# Patient Record
Sex: Male | Born: 1975 | Race: White | Hispanic: No | Smoking: Current every day smoker
Health system: Southern US, Community
[De-identification: ages and names within clinical notes are randomized; demographics above are authoritative.]

## PROBLEM LIST (undated history)

## (undated) DIAGNOSIS — E119 Type 2 diabetes mellitus without complications: Secondary | ICD-10-CM

## (undated) DIAGNOSIS — G8321 Monoplegia of upper limb affecting right dominant side: Secondary | ICD-10-CM

## (undated) DIAGNOSIS — S3992XA Unspecified injury of lower back, initial encounter: Secondary | ICD-10-CM

## (undated) DIAGNOSIS — I10 Essential (primary) hypertension: Secondary | ICD-10-CM

## (undated) DIAGNOSIS — M1990 Unspecified osteoarthritis, unspecified site: Secondary | ICD-10-CM

## (undated) DIAGNOSIS — G43909 Migraine, unspecified, not intractable, without status migrainosus: Secondary | ICD-10-CM

---

## 1996-04-20 DIAGNOSIS — S3992XA Unspecified injury of lower back, initial encounter: Secondary | ICD-10-CM

## 1996-04-20 DIAGNOSIS — M1990 Unspecified osteoarthritis, unspecified site: Secondary | ICD-10-CM

## 1996-04-20 DIAGNOSIS — G8321 Monoplegia of upper limb affecting right dominant side: Secondary | ICD-10-CM

## 1996-04-20 HISTORY — DX: Unspecified osteoarthritis, unspecified site: M19.90

## 1996-04-20 HISTORY — DX: Monoplegia of upper limb affecting right dominant side: G83.21

## 1996-04-20 HISTORY — DX: Unspecified injury of lower back, initial encounter: S39.92XA

## 2000-04-20 HISTORY — PX: NERVE TRANSFER: SHX2084

## 2002-01-23 ENCOUNTER — Emergency Department (HOSPITAL_COMMUNITY): Admission: EM | Admit: 2002-01-23 | Discharge: 2002-01-23 | Payer: Self-pay | Admitting: Emergency Medicine

## 2003-07-30 ENCOUNTER — Ambulatory Visit (HOSPITAL_COMMUNITY): Admission: RE | Admit: 2003-07-30 | Discharge: 2003-07-30 | Payer: Self-pay | Admitting: Family Medicine

## 2005-11-24 ENCOUNTER — Other Ambulatory Visit: Payer: Self-pay

## 2005-11-24 ENCOUNTER — Emergency Department: Payer: Self-pay | Admitting: Emergency Medicine

## 2005-11-25 ENCOUNTER — Other Ambulatory Visit: Payer: Self-pay

## 2005-12-25 ENCOUNTER — Ambulatory Visit: Payer: Self-pay | Admitting: Family Medicine

## 2006-07-06 ENCOUNTER — Encounter: Admission: RE | Admit: 2006-07-06 | Discharge: 2006-07-07 | Payer: Self-pay | Admitting: Neurosurgery

## 2007-11-30 ENCOUNTER — Encounter: Admission: RE | Admit: 2007-11-30 | Discharge: 2007-11-30 | Payer: Self-pay | Admitting: Neurosurgery

## 2008-01-04 ENCOUNTER — Ambulatory Visit (HOSPITAL_COMMUNITY): Admission: RE | Admit: 2008-01-04 | Discharge: 2008-01-04 | Payer: Self-pay | Admitting: Neurosurgery

## 2008-08-29 ENCOUNTER — Encounter: Admission: RE | Admit: 2008-08-29 | Discharge: 2008-11-26 | Payer: Self-pay | Admitting: Physician Assistant

## 2009-06-18 ENCOUNTER — Encounter: Admission: RE | Admit: 2009-06-18 | Discharge: 2009-07-02 | Payer: Self-pay | Admitting: Family Medicine

## 2010-05-12 ENCOUNTER — Encounter: Payer: Self-pay | Admitting: Neurosurgery

## 2012-05-05 ENCOUNTER — Ambulatory Visit: Payer: Medicare Other | Attending: Neurosurgery | Admitting: Occupational Therapy

## 2012-05-05 DIAGNOSIS — M25649 Stiffness of unspecified hand, not elsewhere classified: Secondary | ICD-10-CM | POA: Insufficient documentation

## 2012-05-05 DIAGNOSIS — IMO0001 Reserved for inherently not codable concepts without codable children: Secondary | ICD-10-CM | POA: Insufficient documentation

## 2012-05-05 DIAGNOSIS — R609 Edema, unspecified: Secondary | ICD-10-CM | POA: Insufficient documentation

## 2012-05-12 ENCOUNTER — Ambulatory Visit: Payer: Medicare Other | Admitting: Occupational Therapy

## 2012-07-20 ENCOUNTER — Ambulatory Visit: Payer: Medicare Other | Attending: Neurosurgery | Admitting: Occupational Therapy

## 2012-07-20 DIAGNOSIS — R609 Edema, unspecified: Secondary | ICD-10-CM | POA: Insufficient documentation

## 2012-07-20 DIAGNOSIS — IMO0001 Reserved for inherently not codable concepts without codable children: Secondary | ICD-10-CM | POA: Insufficient documentation

## 2012-07-20 DIAGNOSIS — M25649 Stiffness of unspecified hand, not elsewhere classified: Secondary | ICD-10-CM | POA: Insufficient documentation

## 2013-04-20 HISTORY — PX: SKIN GRAFT: SHX250

## 2013-05-03 ENCOUNTER — Other Ambulatory Visit: Payer: Self-pay | Admitting: Family Medicine

## 2013-05-03 DIAGNOSIS — R945 Abnormal results of liver function studies: Secondary | ICD-10-CM

## 2013-05-03 DIAGNOSIS — R7989 Other specified abnormal findings of blood chemistry: Secondary | ICD-10-CM

## 2013-05-10 ENCOUNTER — Ambulatory Visit
Admission: RE | Admit: 2013-05-10 | Discharge: 2013-05-10 | Disposition: A | Discharge status: Home / Self Care | Payer: Medicare Other | Source: Ambulatory Visit | Attending: Family Medicine | Admitting: Family Medicine

## 2013-05-10 DIAGNOSIS — R945 Abnormal results of liver function studies: Secondary | ICD-10-CM

## 2013-05-10 DIAGNOSIS — R7989 Other specified abnormal findings of blood chemistry: Secondary | ICD-10-CM

## 2014-05-08 ENCOUNTER — Other Ambulatory Visit: Payer: Self-pay | Admitting: Family Medicine

## 2014-05-08 DIAGNOSIS — M898X9 Other specified disorders of bone, unspecified site: Secondary | ICD-10-CM

## 2014-05-09 ENCOUNTER — Ambulatory Visit
Admission: RE | Admit: 2014-05-09 | Discharge: 2014-05-09 | Disposition: A | Discharge status: Home / Self Care | Payer: Medicare Other | Source: Ambulatory Visit | Attending: Family Medicine | Admitting: Family Medicine

## 2014-05-09 DIAGNOSIS — M898X9 Other specified disorders of bone, unspecified site: Secondary | ICD-10-CM

## 2014-07-30 ENCOUNTER — Ambulatory Visit: Payer: Medicare Other | Attending: Family Medicine | Admitting: Occupational Therapy

## 2014-07-30 DIAGNOSIS — M79641 Pain in right hand: Secondary | ICD-10-CM | POA: Diagnosis not present
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
## 2014-10-29 ENCOUNTER — Encounter
Admission: RE | Admit: 2014-10-29 | Discharge: 2014-10-29 | Disposition: A | Discharge status: Home / Self Care | Payer: Medicare Other | Source: Ambulatory Visit | Attending: Podiatry | Admitting: Podiatry

## 2014-10-29 DIAGNOSIS — M7751 Other enthesopathy of right foot: Secondary | ICD-10-CM | POA: Insufficient documentation

## 2014-10-29 DIAGNOSIS — E119 Type 2 diabetes mellitus without complications: Secondary | ICD-10-CM | POA: Diagnosis not present

## 2014-10-29 DIAGNOSIS — Z01810 Encounter for preprocedural cardiovascular examination: Secondary | ICD-10-CM | POA: Insufficient documentation

## 2014-10-29 DIAGNOSIS — I10 Essential (primary) hypertension: Secondary | ICD-10-CM | POA: Insufficient documentation

## 2014-10-29 DIAGNOSIS — Z01812 Encounter for preprocedural laboratory examination: Secondary | ICD-10-CM | POA: Diagnosis not present

## 2014-10-29 HISTORY — DX: Unspecified osteoarthritis, unspecified site: M19.90

## 2014-10-29 HISTORY — DX: Unspecified injury of lower back, initial encounter: S39.92XA

## 2014-10-29 HISTORY — DX: Essential (primary) hypertension: I10

## 2014-10-29 HISTORY — DX: Type 2 diabetes mellitus without complications: E11.9

## 2014-10-29 HISTORY — DX: Monoplegia of upper limb affecting right dominant side: G83.21

## 2014-10-29 LAB — CBC
HCT: 46.4 % (ref 40.0–52.0)
Hemoglobin: 15.5 g/dL (ref 13.0–18.0)
MCH: 30.1 pg (ref 26.0–34.0)
MCHC: 33.4 g/dL (ref 32.0–36.0)
MCV: 90.1 fL (ref 80.0–100.0)
Platelets: 199 10*3/uL (ref 150–440)
RBC: 5.15 MIL/uL (ref 4.40–5.90)
RDW: 13.3 % (ref 11.5–14.5)
WBC: 9.8 10*3/uL (ref 3.8–10.6)

## 2014-10-29 LAB — BASIC METABOLIC PANEL
Anion gap: 9 (ref 5–15)
BUN: 12 mg/dL (ref 6–20)
CALCIUM: 9 mg/dL (ref 8.9–10.3)
CHLORIDE: 106 mmol/L (ref 101–111)
CO2: 24 mmol/L (ref 22–32)
Creatinine, Ser: 0.9 mg/dL (ref 0.61–1.24)
GFR calc non Af Amer: >60 mL/min (ref >60)
Glucose, Bld: 109 mg/dL — ABNORMAL HIGH (ref 65–99)
POTASSIUM: 3.7 mmol/L (ref 3.5–5.1)
SODIUM: 139 mmol/L (ref 135–145)

## 2014-10-29 LAB — DIFFERENTIAL
BASOS PCT: 0 %
Basophils Absolute: 0 10*3/uL (ref 0–0.1)
EOS ABS: 0.3 10*3/uL (ref 0–0.7)
EOS PCT: 3 %
LYMPHS ABS: 2 10*3/uL (ref 1.0–3.6)
LYMPHS PCT: 20 %
MONOS PCT: 5 %
Monocytes Absolute: 0.5 10*3/uL (ref 0.2–1.0)
NEUTROS PCT: 72 %
Neutro Abs: 7 10*3/uL — ABNORMAL HIGH (ref 1.4–6.5)

## 2014-10-29 NOTE — Patient Instructions (Signed)
  Your procedure is scheduled on: Friday November 02, 2014 Report to Same Day Surgery. To find out your arrival time please call [phone] between 1PM - 3PM on Thursday November 01, 2014.  Remember: Instructions that are not followed completely may result in serious medical risk, up to and including death, or upon the discretion of your surgeon and anesthesiologist your surgery may need to be rescheduled.    __x__ 1. Do not eat food or drink liquids after midnight. No gum chewing or hard candies.     __x__ 2. No Alcohol for 24 hours before or after surgery.   ____ 3. Bring all medications with you on the day of surgery if instructed.    __x__ 4. Notify your doctor if there is any change in your medical condition     (cold, fever, infections).     Do not wear jewelry, make-up, hairpins, clips or nail polish.  Do not wear lotions, powders, or perfumes. You may wear deodorant.  Do not shave 48 hours prior to surgery. Men may shave face and neck.  Do not bring valuables to the hospital.    [facility] is not responsible for any belongings or valuables.               Contacts, dentures or bridgework may not be worn into surgery.  Leave your suitcase in the car. After surgery it may be brought to your room.  For patients admitted to the hospital, discharge time is determined by your treatment team.   Patients discharged the day of surgery will not be allowed to drive home.    Please read over the following fact sheets that you were given:   [facility] Preparing for Surgery  __x__ Take these medicines the morning of surgery with A SIP OF WATER:    1. amLODipine (NORVASC)  2. gabapentin (NEURONTIN)  3. losartan (COZAAR)   4.HYDROcodone-acetaminophen (NORCO) (IF NEEDED)   ____ Fleet Enema (as directed)   _x___ Use CHG Soap as directed  ____ Use inhalers on the day of surgery  ____ Stop metformin 2 days prior to surgery    ____ Take 1/2 of usual insulin dose the night before surgery  and none on the morning of surgery.   ____ Stop Coumadin/Plavix/aspirin on does not apply.  __x__ Stop Anti-inflammatories (Ibuprofen, Aleve, Motrin) stop today. May take Norco or Tylenol for pain.  ____ Stop supplements until after surgery.    ____ Bring C-Pap to the hospital.

## 2014-11-02 ENCOUNTER — Ambulatory Visit: Payer: Medicare Other | Admitting: Anesthesiology

## 2014-11-02 ENCOUNTER — Ambulatory Visit
Admission: RE | Admit: 2014-11-02 | Discharge: 2014-11-02 | Disposition: A | Discharge status: Home / Self Care | Payer: Medicare Other | Source: Ambulatory Visit | Attending: Podiatry | Admitting: Podiatry

## 2014-11-02 ENCOUNTER — Encounter
Admission: RE | Disposition: A | Discharge status: Home / Self Care | Payer: Self-pay | Source: Ambulatory Visit | Attending: Podiatry

## 2014-11-02 ENCOUNTER — Encounter: Payer: Self-pay | Admitting: *Deleted

## 2014-11-02 DIAGNOSIS — M205X1 Other deformities of toe(s) (acquired), right foot: Secondary | ICD-10-CM | POA: Insufficient documentation

## 2014-11-02 DIAGNOSIS — G8929 Other chronic pain: Secondary | ICD-10-CM | POA: Diagnosis present

## 2014-11-02 DIAGNOSIS — Z79899 Other long term (current) drug therapy: Secondary | ICD-10-CM | POA: Insufficient documentation

## 2014-11-02 HISTORY — PX: WEIL OSTEOTOMY: SHX5044

## 2014-11-02 LAB — GLUCOSE, CAPILLARY: Glucose-Capillary: 93 mg/dL (ref 65–99)

## 2014-11-02 SURGERY — OSTEOTOMY, WEIL
Anesthesia: General | Laterality: Right | Wound class: Clean

## 2014-11-02 MED ORDER — ONDANSETRON HCL 4 MG/2ML IJ SOLN
INTRAMUSCULAR | Status: DC | PRN
  Administered 2014-11-02: 4 mg via INTRAVENOUS

## 2014-11-02 MED ORDER — OXYCODONE-ACETAMINOPHEN 5-325 MG PO TABS: 1.0000 | ORAL_TABLET | ORAL | Status: DC | PRN

## 2014-11-02 MED ORDER — CEFAZOLIN SODIUM-DEXTROSE 2-3 GM-% IV SOLR
INTRAVENOUS | Status: AC
  Administered 2014-11-02: 2 g via INTRAVENOUS
  Filled 2014-11-02: qty 50

## 2014-11-02 MED ORDER — OXYCODONE-ACETAMINOPHEN 5-325 MG PO TABS
2.0000 | ORAL_TABLET | Freq: Once | ORAL | Status: AC
  Administered 2014-11-02: 2 via ORAL

## 2014-11-02 MED ORDER — BUPIVACAINE HCL (PF) 0.5 % IJ SOLN
INTRAMUSCULAR | Status: DC | PRN
  Administered 2014-11-02: 10 mL

## 2014-11-02 MED ORDER — BUPIVACAINE HCL (PF) 0.5 % IJ SOLN
INTRAMUSCULAR | Status: AC
  Filled 2014-11-02: qty 30

## 2014-11-02 MED ORDER — MIDAZOLAM HCL 2 MG/2ML IJ SOLN
INTRAMUSCULAR | Status: DC | PRN
  Administered 2014-11-02: 2 mg via INTRAVENOUS

## 2014-11-02 MED ORDER — ONDANSETRON HCL 4 MG/2ML IJ SOLN: 4.0000 mg | Freq: Once | INTRAMUSCULAR | Status: DC | PRN

## 2014-11-02 MED ORDER — FENTANYL CITRATE (PF) 100 MCG/2ML IJ SOLN
INTRAMUSCULAR | Status: DC | PRN
  Administered 2014-11-02: 50 ug via INTRAVENOUS
  Administered 2014-11-02: 100 ug via INTRAVENOUS

## 2014-11-02 MED ORDER — FENTANYL CITRATE (PF) 100 MCG/2ML IJ SOLN: 25.0000 ug | INTRAMUSCULAR | Status: DC | PRN

## 2014-11-02 MED ORDER — SODIUM CHLORIDE 0.9 % IV SOLN
INTRAVENOUS | Status: DC
  Administered 2014-11-02 (×2): via INTRAVENOUS

## 2014-11-02 MED ORDER — OXYCODONE-ACETAMINOPHEN 5-325 MG PO TABS
ORAL_TABLET | ORAL | Status: AC
  Filled 2014-11-02: qty 2

## 2014-11-02 MED ORDER — CEFAZOLIN SODIUM-DEXTROSE 2-3 GM-% IV SOLR: 2.0000 g | Freq: Once | INTRAVENOUS | Status: DC

## 2014-11-02 MED ORDER — HYDROMORPHONE HCL 1 MG/ML IJ SOLN: 0.2500 mg | INTRAMUSCULAR | Status: DC | PRN

## 2014-11-02 MED ORDER — FAMOTIDINE 20 MG PO TABS
ORAL_TABLET | ORAL | Status: AC
  Administered 2014-11-02: 20 mg via ORAL
  Filled 2014-11-02: qty 1

## 2014-11-02 MED ORDER — FAMOTIDINE 20 MG PO TABS
20.0000 mg | ORAL_TABLET | Freq: Once | ORAL | Status: AC
  Administered 2014-11-02: 20 mg via ORAL

## 2014-11-02 MED ORDER — PROPOFOL 10 MG/ML IV BOLUS
INTRAVENOUS | Status: DC | PRN
  Administered 2014-11-02: 200 mg via INTRAVENOUS

## 2014-11-02 SURGICAL SUPPLY — 52 items
BAG COUNTER SPONGE EZ (MISCELLANEOUS) IMPLANT
BAG SPNG 4X4 CLR HAZ (MISCELLANEOUS)
BANDAGE CONFORM 2X5YD N/S (GAUZE/BANDAGES/DRESSINGS) IMPLANT
BANDAGE ELASTIC 4 CLIP NS LF (GAUZE/BANDAGES/DRESSINGS) ×3 IMPLANT
BIT DRILL CANN 3.0 (BIT) ×3 IMPLANT
BIT DRILL TWST COUNTRSNK 122IN (MISCELLANEOUS) ×1 IMPLANT
BLADE OSC/SAGITTAL MD 5.5X18 (BLADE) ×3 IMPLANT
BLADE OSC/SAGITTAL MD 9X18.5 (BLADE) IMPLANT
BLADE SURG 15 STRL LF DISP TIS (BLADE) ×4 IMPLANT
BLADE SURG 15 STRL SS (BLADE) ×12
BLADE SURG MINI STRL (BLADE) ×6 IMPLANT
BNDG ESMARK 4X12 TAN STRL LF (GAUZE/BANDAGES/DRESSINGS) ×3 IMPLANT
CANISTER SUCT 1200ML W/VALVE (MISCELLANEOUS) ×3 IMPLANT
CLOSURE WOUND 1/4X4 (GAUZE/BANDAGES/DRESSINGS) ×1
COUNTER SPONGE BAG EZ (MISCELLANEOUS)
CUFF TOURN 18 STER (MISCELLANEOUS) ×3 IMPLANT
CUFF TOURN DUAL PL 12 NO SLV (MISCELLANEOUS) IMPLANT
DRAPE FLUOR MINI C-ARM 54X84 (DRAPES) ×3 IMPLANT
DRILL TWIST COUNTERSINK 122IN (MISCELLANEOUS) ×3
DURAPREP 26ML APPLICATOR (WOUND CARE) ×3 IMPLANT
GAUZE PETRO XEROFOAM 1X8 (MISCELLANEOUS) ×3 IMPLANT
GAUZE SPONGE 4X4 12PLY STRL (GAUZE/BANDAGES/DRESSINGS) ×3 IMPLANT
GAUZE STRETCH 2X75IN STRL (MISCELLANEOUS) ×6 IMPLANT
GLOVE BIO SURGEON STRL SZ7.5 (GLOVE) ×6 IMPLANT
GLOVE INDICATOR 8.0 STRL GRN (GLOVE) ×9 IMPLANT
GOWN STRL REUS W/ TWL LRG LVL3 (GOWN DISPOSABLE) ×2 IMPLANT
GOWN STRL REUS W/TWL LRG LVL3 (GOWN DISPOSABLE) ×6
K-WIRE TROCAR .8X100 (WIRE) ×3 IMPLANT
LABEL OR SOLS (LABEL) IMPLANT
NDL SAFETY 25GX1.5 (NEEDLE) ×6 IMPLANT
NEEDLE FILTER BLUNT 18X 1/2SAF (NEEDLE) ×2
NEEDLE FILTER BLUNT 18X1 1/2 (NEEDLE) ×1 IMPLANT
NS IRRIG 500ML POUR BTL (IV SOLUTION) ×3 IMPLANT
PACK EXTREMITY ARMC (MISCELLANEOUS) ×3 IMPLANT
PAD GROUND ADULT SPLIT (MISCELLANEOUS) ×3 IMPLANT
PENCIL ELECTRO HAND CTR (MISCELLANEOUS) ×3 IMPLANT
RASP SM TEAR CROSS CUT (RASP) IMPLANT
SCREW CAN 2.2X15 (Screw) ×3 IMPLANT
SCREW COUNTERSINK 3.0 (Screw) ×3 IMPLANT
SOL PREP PVP 2OZ (MISCELLANEOUS) ×3
SOLUTION PREP PVP 2OZ (MISCELLANEOUS) ×1 IMPLANT
SPLINT CAST 1 STEP 3X12 (MISCELLANEOUS) ×3 IMPLANT
SPLINT CAST 1 STEP 5X30 WHT (MISCELLANEOUS) ×3 IMPLANT
STOCKINETTE STRL 6IN 960660 (GAUZE/BANDAGES/DRESSINGS) ×3 IMPLANT
STRAP SAFETY BODY (MISCELLANEOUS) ×3 IMPLANT
STRIP CLOSURE SKIN 1/4X4 (GAUZE/BANDAGES/DRESSINGS) ×2 IMPLANT
SUT ETHILON 5-0 FS-2 18 BLK (SUTURE) ×3 IMPLANT
SUT VIC AB 4-0 FS2 27 (SUTURE) ×3 IMPLANT
SWABSTK COMLB BENZOIN TINCTURE (MISCELLANEOUS) ×3 IMPLANT
SYRINGE 10CC LL (SYRINGE) ×3 IMPLANT
WIRE Z .045 C-WIRE SPADE TIP (WIRE) IMPLANT
WIRE Z .062 C-WIRE SPADE TIP (WIRE) IMPLANT

## 2014-11-02 NOTE — Anesthesia Postprocedure Evaluation (Signed)
  Anesthesia Post-op Note  [name]  Procedure(s) Performed: Procedure(s): WEIL OSTEOTOMY (Right)  Anesthesia type:General  Patient location: PACU  Post pain: Pain level controlled  Post assessment: Post-op Vital signs reviewed, Patient's Cardiovascular Status Stable, Respiratory Function Stable, Patent Airway and No signs of Nausea or vomiting  Post vital signs: Reviewed and stable  Last Vitals:  Filed Vitals:   11/02/14 0933  BP: 137/77  Pulse: 70  Temp: 36.6 C  Resp: 16    Level of consciousness: awake, alert  and patient cooperative  Complications: No apparent anesthesia complications

## 2014-11-02 NOTE — Discharge Instructions (Addendum)
1. Elevate right lower extremity.  2. Keep bandage clean, dry, and do not remove.  3. Sponge bathe only right lower extremity.  4. Wear surgical shoe on the right foot whenever walking or standing.  5. Take one pain pill, Percocet, every 4 hours as needed for pain
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
## 2014-11-02 NOTE — Progress Notes (Signed)
Pt from OR with right foot elevated on 2 pillows.

## 2014-11-02 NOTE — Transfer of Care (Signed)
Immediate Anesthesia Transfer of Care Note  [name]  Procedure(s) Performed: Procedure(s): WEIL OSTEOTOMY (Right)  Patient Location: PACU  Anesthesia Type:General  Level of Consciousness: awake  Airway & Oxygen Therapy: Patient Spontanous Breathing  Post-op Assessment: Report given to RN  Post vital signs: Reviewed  Last Vitals:  Filed Vitals:   11/02/14 0856  BP: 108/68  Pulse: 82  Temp: 36.5 C  Resp: 12    Complications: No apparent anesthesia complications

## 2014-11-02 NOTE — Op Note (Signed)
Date of operation: 11/02/2014.  Surgeon: Ricci Barkerodd W Malikah Principato DPM.  Preoperative diagnosis: Plantar displaced fifth metatarsal right foot.  Postoperative diagnosis same.  Procedure: Fifth metatarsal osteotomy right foot.  Anesthesia: LMA with local.  Hemostasis: Pneumatic tourniquet right ankle 250 mmHg  Estimated blood loss: Minimal.  Materials: One 15 mm, 2.2 mm Medartis screw.  Complications: None apparent.  Operative indications: This is a 39 year old male with chronic pain from a plantar displaced fifth metatarsal on his right foot. He has tried multiple conservative treatments with no significant improvement and he elects for surgery for an osteotomy of the fifth metatarsal to relieve pressure.  Operative procedure: Patient was taken to the operating room and placed on the table in the supine position. Following satisfactory LMA anesthesia the right foot was anesthetized with 10 cc of 0.5% bupivacaine plain around the fifth metatarsal region. A pneumatic tourniquet was applied at the level of the right ankle and the foot was prepped and draped in the usual sterile fashion. The foot was exsanguinated and the tourniquet inflated to 250 mmHg.   Attention was then directed to the dorsal aspect of the right foot where an approximate 4 cm linear incision was made coursing proximal to distal centered over the fifth metatarsal and metatarsophalangeal joint. The incision was deepened via sharp and blunt dissection with care taken to coagulate all bleeders. At the level of the joint a linear capsulotomy was performed and the capsular and periosteal tissues reflected off of the dorsal and medial head of fifth metatarsal. A lateral prominence was noted which was resected using a bone saw. Next attention was directed to the dorsal aspect of the fifth metatarsal where an osteotomy was performed from dorsal distal to proximal plantar with care taken to leave the proximal portion of the osteotomy and cortex  intact. The osteotomy was then feathered to raise the fifth metatarsal. There was noted to be good reduction of the deformity clinically and then the osteotomy was irrigated with sterile saline. Next a 2.2 mm Medartis screw, 15 mm length, was inserted in standard fashion into the fifth metatarsal. There was noted be good coaptation of the osteotomy and placement of the screw. Intraoperative FluoroScan views revealed good placement of the screw and osteotomy. The wound was then flushed with copious amounts of sterile saline and closed using 40 Vicryls running suture for all layers from capsular and periosteal to deep and superficial subcutaneous followed by skin closure. Tincture of benzoin and Steri-Strips applied followed by 4 x 4's and a sterile bandage. The tourniquet was released and blood flow noted to return immediately to the foot 5. An Ace wrap was applied for compression. Patient tolerated procedure and anesthesia well and was transported to the PACU with vital signs stable and in good condition.

## 2014-11-02 NOTE — H&P (Signed)
History and physical in the chart was reviewed. No changes in stable for surgery.

## 2014-11-02 NOTE — Anesthesia Preprocedure Evaluation (Addendum)
Anesthesia Evaluation  Patient identified by MRN, date of birth, ID band Patient awake    Reviewed: Allergy & Precautions, NPO status , Patient's Chart, lab work & pertinent test results  History of Anesthesia Complications Negative for: history of anesthetic complications  Airway Mallampati: II  TM Distance: >3 FB Neck ROM: Full    Dental  (+) Missing   Pulmonary neg pulmonary ROS, Current Smoker,  breath sounds clear to auscultation  Pulmonary exam normal       Cardiovascular Exercise Tolerance: Good hypertension, Pt. on medications Normal cardiovascular examRhythm:Regular Rate:Normal     Neuro/Psych negative neurological ROS  negative psych ROS   GI/Hepatic negative GI ROS, Neg liver ROS,   Endo/Other  diabetes, Well Controlled, Type 2, Oral Hypoglycemic Agents  Renal/GU negative Renal ROS  negative genitourinary   Musculoskeletal  (+) Arthritis -, Osteoarthritis,    Abdominal   Peds negative pediatric ROS (+)  Hematology negative hematology ROS (+)   Anesthesia Other Findings   Reproductive/Obstetrics negative OB ROS                            Anesthesia Physical Anesthesia Plan  ASA: III  Anesthesia Plan: General   Post-op Pain Management:    Induction: Intravenous  Airway Management Planned: LMA  Additional Equipment:   Intra-op Plan:   Post-operative Plan: Extubation in OR  Informed Consent: I have reviewed the patients History and Physical, chart, labs and discussed the procedure including the risks, benefits and alternatives for the proposed anesthesia with the patient or authorized representative who has indicated his/her understanding and acceptance.   Dental advisory given  Plan Discussed with: CRNA and Surgeon  Anesthesia Plan Comments:         Anesthesia Quick Evaluation

## 2015-02-19 ENCOUNTER — Encounter: Payer: Self-pay | Admitting: Occupational Therapy

## 2015-02-19 NOTE — Therapy (Signed)
[facility] [address] [place], [place], [address] Phone: [phone]   Fax:  [phone]  Patient Details  Name: Jonathon Page MRN: [id number] Date of Birth: 07/02/1975 Referring Provider:  No ref. provider found  Encounter Date: 02/19/2015   Pt has not returned since O.T. Visit on 07/30/14 for splinting purposes. Will resolve episode of care  Kelli ChurnBallie, Kaeden Mester Johnson, OTR/L 02/19/2015, 8:35 AM  [facility] [address] [place], [place], [address] Phone: [phone]   Fax:  [phone]

## 2018-11-29 ENCOUNTER — Ambulatory Visit: Payer: Medicare Other | Attending: Nurse Practitioner | Admitting: Occupational Therapy

## 2018-11-29 ENCOUNTER — Other Ambulatory Visit: Payer: Self-pay

## 2018-11-29 DIAGNOSIS — M79601 Pain in right arm: Secondary | ICD-10-CM | POA: Diagnosis present
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
## 2019-04-05 ENCOUNTER — Emergency Department
Admission: EM | Admit: 2019-04-05 | Discharge: 2019-04-05 | Discharge status: Against Medical Advice | Payer: Medicare Other | Attending: Emergency Medicine | Admitting: Emergency Medicine

## 2019-04-05 ENCOUNTER — Other Ambulatory Visit: Payer: Self-pay

## 2019-04-05 DIAGNOSIS — R1032 Left lower quadrant pain: Secondary | ICD-10-CM | POA: Diagnosis present

## 2019-04-05 DIAGNOSIS — F17210 Nicotine dependence, cigarettes, uncomplicated: Secondary | ICD-10-CM | POA: Diagnosis not present

## 2019-04-05 DIAGNOSIS — Z7984 Long term (current) use of oral hypoglycemic drugs: Secondary | ICD-10-CM | POA: Insufficient documentation

## 2019-04-05 DIAGNOSIS — I10 Essential (primary) hypertension: Secondary | ICD-10-CM | POA: Diagnosis not present

## 2019-04-05 DIAGNOSIS — Z5320 Procedure and treatment not carried out because of patient's decision for unspecified reasons: Secondary | ICD-10-CM | POA: Diagnosis not present

## 2019-04-05 DIAGNOSIS — Z79899 Other long term (current) drug therapy: Secondary | ICD-10-CM | POA: Diagnosis not present

## 2019-04-05 DIAGNOSIS — E119 Type 2 diabetes mellitus without complications: Secondary | ICD-10-CM | POA: Insufficient documentation

## 2019-04-05 MED ORDER — ONDANSETRON HCL 4 MG/2ML IJ SOLN: 4.0000 mg | Freq: Once | INTRAMUSCULAR | Status: DC

## 2019-04-05 MED ORDER — FENTANYL CITRATE (PF) 100 MCG/2ML IJ SOLN: 50.0000 ug | Freq: Once | INTRAMUSCULAR | Status: DC

## 2019-04-05 NOTE — Discharge Instructions (Addendum)
Please return as soon as possible to continue your evaluation
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
## 2019-04-05 NOTE — ED Triage Notes (Signed)
Pt to the er for lower abd pain that started last night. When asked if pt was having any other symptoms, pt states he has a hx of panic attacks and feels like that that is making it worse. Pt denies any other symptoms.

## 2019-04-23 ENCOUNTER — Emergency Department: Payer: Medicare Other

## 2019-04-23 ENCOUNTER — Other Ambulatory Visit: Payer: Self-pay

## 2019-04-23 ENCOUNTER — Inpatient Hospital Stay
Admission: EM | Admit: 2019-04-23 | Discharge: 2019-04-24 | DRG: 378 | Discharge status: Against Medical Advice | Payer: Medicare Other | Attending: Internal Medicine | Admitting: Internal Medicine

## 2019-04-23 DIAGNOSIS — K922 Gastrointestinal hemorrhage, unspecified: Secondary | ICD-10-CM | POA: Diagnosis not present

## 2019-04-23 DIAGNOSIS — Z5329 Procedure and treatment not carried out because of patient's decision for other reasons: Secondary | ICD-10-CM | POA: Diagnosis present

## 2019-04-23 DIAGNOSIS — R112 Nausea with vomiting, unspecified: Secondary | ICD-10-CM | POA: Diagnosis not present

## 2019-04-23 DIAGNOSIS — I160 Hypertensive urgency: Secondary | ICD-10-CM | POA: Diagnosis present

## 2019-04-23 DIAGNOSIS — F419 Anxiety disorder, unspecified: Secondary | ICD-10-CM | POA: Diagnosis present

## 2019-04-23 DIAGNOSIS — N179 Acute kidney failure, unspecified: Secondary | ICD-10-CM | POA: Diagnosis present

## 2019-04-23 DIAGNOSIS — Z20822 Contact with and (suspected) exposure to covid-19: Secondary | ICD-10-CM | POA: Diagnosis present

## 2019-04-23 DIAGNOSIS — E119 Type 2 diabetes mellitus without complications: Secondary | ICD-10-CM | POA: Diagnosis present

## 2019-04-23 DIAGNOSIS — I10 Essential (primary) hypertension: Secondary | ICD-10-CM | POA: Diagnosis present

## 2019-04-23 DIAGNOSIS — Z7984 Long term (current) use of oral hypoglycemic drugs: Secondary | ICD-10-CM | POA: Diagnosis not present

## 2019-04-23 DIAGNOSIS — E860 Dehydration: Secondary | ICD-10-CM | POA: Diagnosis present

## 2019-04-23 DIAGNOSIS — E1142 Type 2 diabetes mellitus with diabetic polyneuropathy: Secondary | ICD-10-CM | POA: Diagnosis present

## 2019-04-23 DIAGNOSIS — Z79899 Other long term (current) drug therapy: Secondary | ICD-10-CM

## 2019-04-23 DIAGNOSIS — Z79891 Long term (current) use of opiate analgesic: Secondary | ICD-10-CM | POA: Diagnosis not present

## 2019-04-23 DIAGNOSIS — F17210 Nicotine dependence, cigarettes, uncomplicated: Secondary | ICD-10-CM | POA: Diagnosis present

## 2019-04-23 LAB — BASIC METABOLIC PANEL
Anion gap: 14 (ref 5–15)
BUN: 33 mg/dL — ABNORMAL HIGH (ref 6–20)
CO2: 31 mmol/L (ref 22–32)
Calcium: 9.6 mg/dL (ref 8.9–10.3)
Chloride: 99 mmol/L (ref 98–111)
Creatinine, Ser: 1.61 mg/dL — ABNORMAL HIGH (ref 0.61–1.24)
GFR calc Af Amer: >60 mL/min (ref >60)
GFR calc non Af Amer: 52 mL/min — ABNORMAL LOW (ref >60)
Glucose, Bld: 160 mg/dL — ABNORMAL HIGH (ref 70–99)
Potassium: 3.9 mmol/L (ref 3.5–5.1)
Sodium: 144 mmol/L (ref 135–145)

## 2019-04-23 LAB — CBC
HCT: 50.8 % (ref 39.0–52.0)
HCT: 49.6 % (ref 39.0–52.0)
Hemoglobin: 18.1 g/dL — ABNORMAL HIGH (ref 13.0–17.0)
Hemoglobin: 16.5 g/dL (ref 13.0–17.0)
MCH: 29.8 pg (ref 26.0–34.0)
MCH: 29.7 pg (ref 26.0–34.0)
MCHC: 35.6 g/dL (ref 30.0–36.0)
MCHC: 33.3 g/dL (ref 30.0–36.0)
MCV: 83.6 fL (ref 80.0–100.0)
MCV: 89.2 fL (ref 80.0–100.0)
Platelets: 301 10*3/uL (ref 150–400)
Platelets: 306 10*3/uL (ref 150–400)
RBC: 6.08 MIL/uL — ABNORMAL HIGH (ref 4.22–5.81)
RBC: 5.56 MIL/uL (ref 4.22–5.81)
RDW: 13.8 % (ref 11.5–15.5)
RDW: 13.7 % (ref 11.5–15.5)
WBC: 17.5 10*3/uL — ABNORMAL HIGH (ref 4.0–10.5)
WBC: 14.7 10*3/uL — ABNORMAL HIGH (ref 4.0–10.5)
nRBC: 0 % (ref 0.0–0.2)
nRBC: 0 % (ref 0.0–0.2)

## 2019-04-23 LAB — COMPREHENSIVE METABOLIC PANEL
ALT: 36 U/L (ref 0–44)
AST: 34 U/L (ref 15–41)
Albumin: 4.8 g/dL (ref 3.5–5.0)
Alkaline Phosphatase: 75 U/L (ref 38–126)
Anion gap: 17 — ABNORMAL HIGH (ref 5–15)
BUN: 34 mg/dL — ABNORMAL HIGH (ref 6–20)
CO2: 27 mmol/L (ref 22–32)
Calcium: 10.6 mg/dL — ABNORMAL HIGH (ref 8.9–10.3)
Chloride: 97 mmol/L — ABNORMAL LOW (ref 98–111)
Creatinine, Ser: 1.92 mg/dL — ABNORMAL HIGH (ref 0.61–1.24)
GFR calc Af Amer: 49 mL/min — ABNORMAL LOW (ref >60)
GFR calc non Af Amer: 42 mL/min — ABNORMAL LOW (ref >60)
Glucose, Bld: 226 mg/dL — ABNORMAL HIGH (ref 70–99)
Potassium: 3.7 mmol/L (ref 3.5–5.1)
Sodium: 141 mmol/L (ref 135–145)
Total Bilirubin: 0.9 mg/dL (ref 0.3–1.2)
Total Protein: 9.1 g/dL — ABNORMAL HIGH (ref 6.5–8.1)

## 2019-04-23 LAB — LIPASE, BLOOD: Lipase: 25 U/L (ref 11–51)

## 2019-04-23 LAB — HEMOGLOBIN AND HEMATOCRIT, BLOOD
HCT: 45.9 % (ref 39.0–52.0)
Hemoglobin: 16 g/dL (ref 13.0–17.0)

## 2019-04-23 LAB — GLUCOSE, CAPILLARY: Glucose-Capillary: 120 mg/dL — ABNORMAL HIGH (ref 70–99)

## 2019-04-23 MED ORDER — MAGNESIUM HYDROXIDE 400 MG/5ML PO SUSP: 30.0000 mL | Freq: Every day | ORAL | Status: DC | PRN

## 2019-04-23 MED ORDER — PANTOPRAZOLE SODIUM 40 MG IV SOLR: 40.0000 mg | Freq: Two times a day (BID) | INTRAVENOUS | Status: DC

## 2019-04-23 MED ORDER — AMLODIPINE BESYLATE 5 MG PO TABS
10.0000 mg | ORAL_TABLET | ORAL | Status: DC
  Administered 2019-04-24: 09:00:00 10 mg via ORAL
  Filled 2019-04-23: qty 2

## 2019-04-23 MED ORDER — ONDANSETRON HCL 4 MG/2ML IJ SOLN
4.0000 mg | Freq: Once | INTRAMUSCULAR | Status: AC
  Administered 2019-04-23: 16:00:00 4 mg via INTRAVENOUS
  Filled 2019-04-23: qty 2

## 2019-04-23 MED ORDER — CLONAZEPAM 1 MG PO TABS: 1.0000 mg | ORAL_TABLET | Freq: Three times a day (TID) | ORAL | Status: DC

## 2019-04-23 MED ORDER — SODIUM CHLORIDE 0.9 % IV SOLN: Freq: Once | INTRAVENOUS | Status: AC

## 2019-04-23 MED ORDER — LABETALOL HCL 5 MG/ML IV SOLN: 20.0000 mg | INTRAVENOUS | Status: DC | PRN

## 2019-04-23 MED ORDER — GABAPENTIN 600 MG PO TABS
600.0000 mg | ORAL_TABLET | Freq: Three times a day (TID) | ORAL | Status: DC
  Administered 2019-04-23 – 2019-04-24 (×2): 600 mg via ORAL
  Filled 2019-04-23 (×2): qty 1

## 2019-04-23 MED ORDER — SODIUM CHLORIDE 0.9 % IV SOLN: INTRAVENOUS | Status: DC

## 2019-04-23 MED ORDER — CLONAZEPAM 0.5 MG PO TABS: 0.5000 mg | ORAL_TABLET | Freq: Two times a day (BID) | ORAL | Status: DC | PRN

## 2019-04-23 MED ORDER — SUMATRIPTAN SUCCINATE 50 MG PO TABS
100.0000 mg | ORAL_TABLET | Freq: Once | ORAL | Status: DC | PRN
  Filled 2019-04-23: qty 2

## 2019-04-23 MED ORDER — LOSARTAN POTASSIUM 50 MG PO TABS: 100.0000 mg | ORAL_TABLET | ORAL | Status: DC

## 2019-04-23 MED ORDER — OXYCODONE HCL 5 MG PO TABS: 15.0000 mg | ORAL_TABLET | Freq: Four times a day (QID) | ORAL | Status: DC | PRN

## 2019-04-23 MED ORDER — MORPHINE SULFATE (PF) 4 MG/ML IV SOLN
4.0000 mg | Freq: Once | INTRAVENOUS | Status: AC
  Administered 2019-04-23: 16:00:00 4 mg via INTRAVENOUS
  Filled 2019-04-23: qty 1

## 2019-04-23 MED ORDER — ASPIRIN EC 81 MG PO TBEC: 81.0000 mg | DELAYED_RELEASE_TABLET | Freq: Every day | ORAL | Status: DC

## 2019-04-23 MED ORDER — OXYCODONE-ACETAMINOPHEN 5-325 MG PO TABS
1.0000 | ORAL_TABLET | ORAL | Status: DC | PRN
  Administered 2019-04-24: 09:00:00 2 via ORAL
  Filled 2019-04-23: qty 2

## 2019-04-23 MED ORDER — SODIUM CHLORIDE 0.9 % IV SOLN: 80.0000 mg | Freq: Once | INTRAVENOUS | Status: DC

## 2019-04-23 MED ORDER — SODIUM CHLORIDE 0.9 % IV SOLN
80.0000 mg | Freq: Once | INTRAVENOUS | Status: AC
  Administered 2019-04-23: 80 mg via INTRAVENOUS
  Filled 2019-04-23: qty 80

## 2019-04-23 MED ORDER — IOHEXOL 300 MG/ML  SOLN
75.0000 mL | Freq: Once | INTRAMUSCULAR | Status: AC | PRN
  Administered 2019-04-23: 17:00:00 75 mL via INTRAVENOUS

## 2019-04-23 MED ORDER — SODIUM CHLORIDE 0.9 % IV SOLN: 8.0000 mg/h | INTRAVENOUS | Status: DC

## 2019-04-23 MED ORDER — ACETAMINOPHEN 650 MG RE SUPP: 650.0000 mg | Freq: Four times a day (QID) | RECTAL | Status: DC | PRN

## 2019-04-23 MED ORDER — SODIUM CHLORIDE 0.9 % IV SOLN
8.0000 mg/h | INTRAVENOUS | Status: DC
  Administered 2019-04-24: 8 mg/h via INTRAVENOUS
  Filled 2019-04-23: qty 80

## 2019-04-23 MED ORDER — TRAZODONE HCL 50 MG PO TABS: 25.0000 mg | ORAL_TABLET | Freq: Every evening | ORAL | Status: DC | PRN

## 2019-04-23 MED ORDER — ACETAMINOPHEN 325 MG PO TABS: 650.0000 mg | ORAL_TABLET | Freq: Four times a day (QID) | ORAL | Status: DC | PRN

## 2019-04-23 MED ORDER — MORPHINE SULFATE (PF) 4 MG/ML IV SOLN
4.0000 mg | Freq: Once | INTRAVENOUS | Status: AC
  Administered 2019-04-23: 4 mg via INTRAVENOUS
  Filled 2019-04-23: qty 1

## 2019-04-23 MED ORDER — INSULIN ASPART 100 UNIT/ML ~~LOC~~ SOLN: 0.0000 [IU] | Freq: Four times a day (QID) | SUBCUTANEOUS | Status: DC

## 2019-04-23 MED ORDER — PANTOPRAZOLE SODIUM 40 MG IV SOLR: 40.0000 mg | Freq: Once | INTRAVENOUS | Status: DC

## 2019-04-23 NOTE — ED Notes (Signed)
Pt given another warm blanket and lights dimmed again. Bed locked low. Rails up. Call bell within reach.
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
## 2019-04-23 NOTE — ED Notes (Signed)
Pt told several times by nurse that he needs to keep his mask on. Pt refused to wear mask.
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
## 2019-04-23 NOTE — ED Notes (Signed)
Pt noted to have vomited on floor. Vomit dark in color. MD Mayford Knife at bedside to guiac test vomit

## 2019-04-23 NOTE — ED Notes (Addendum)
Pt calmly laying in bed. Relaxed. Pharm called this RN to notify that klonopin yet to be verified as still in process of confirming order since pt also has PRN klonopin. Will hold until orders verified.

## 2019-04-23 NOTE — ED Notes (Signed)
Messaged about Protonix and Klonopin again.

## 2019-04-23 NOTE — ED Triage Notes (Signed)
Pt arrives to ED c/o of vomiting that began last night. States emesis is red and black. Denies ulcer. Denies blood thinner use or hx of same. A&O. Denies diarrhea.

## 2019-04-23 NOTE — ED Notes (Signed)
First Nurse Note: Pt to ED via POV c/o vomiting since last night. Pt is in NAD.

## 2019-04-23 NOTE — ED Notes (Signed)
Pt resting comfortably at this time.

## 2019-04-24 LAB — BASIC METABOLIC PANEL
Anion gap: 11 (ref 5–15)
BUN: 30 mg/dL — ABNORMAL HIGH (ref 6–20)
CO2: 31 mmol/L (ref 22–32)
Calcium: 9.1 mg/dL (ref 8.9–10.3)
Chloride: 102 mmol/L (ref 98–111)
Creatinine, Ser: 1.23 mg/dL (ref 0.61–1.24)
GFR calc Af Amer: >60 mL/min (ref >60)
GFR calc non Af Amer: >60 mL/min (ref >60)
Glucose, Bld: 128 mg/dL — ABNORMAL HIGH (ref 70–99)
Potassium: 3.9 mmol/L (ref 3.5–5.1)
Sodium: 144 mmol/L (ref 135–145)

## 2019-04-24 LAB — CBC
HCT: 48 % (ref 39.0–52.0)
Hemoglobin: 15.3 g/dL (ref 13.0–17.0)
MCH: 29 pg (ref 26.0–34.0)
MCHC: 31.9 g/dL (ref 30.0–36.0)
MCV: 91.1 fL (ref 80.0–100.0)
Platelets: 212 10*3/uL (ref 150–400)
RBC: 5.27 MIL/uL (ref 4.22–5.81)
RDW: 14.1 % (ref 11.5–15.5)
WBC: 15 10*3/uL — ABNORMAL HIGH (ref 4.0–10.5)
nRBC: 0 % (ref 0.0–0.2)

## 2019-04-24 LAB — HEPATIC FUNCTION PANEL
ALT: 28 U/L (ref 0–44)
AST: 24 U/L (ref 15–41)
Albumin: 4 g/dL (ref 3.5–5.0)
Alkaline Phosphatase: 58 U/L (ref 38–126)
Bilirubin, Direct: 0.1 mg/dL (ref 0.0–0.2)
Indirect Bilirubin: 0.8 mg/dL (ref 0.3–0.9)
Total Bilirubin: 0.9 mg/dL (ref 0.3–1.2)
Total Protein: 7.4 g/dL (ref 6.5–8.1)

## 2019-04-24 LAB — GLUCOSE, CAPILLARY
Glucose-Capillary: 112 mg/dL — ABNORMAL HIGH (ref 70–99)
Glucose-Capillary: 117 mg/dL — ABNORMAL HIGH (ref 70–99)
Glucose-Capillary: 128 mg/dL — ABNORMAL HIGH (ref 70–99)

## 2019-04-24 LAB — SARS CORONAVIRUS 2 (TAT 6-24 HRS): SARS Coronavirus 2: NEGATIVE

## 2019-04-24 LAB — HEMOGLOBIN A1C
Hgb A1c MFr Bld: 6.5 % — ABNORMAL HIGH (ref 4.8–5.6)
Mean Plasma Glucose: 139.85 mg/dL

## 2019-04-24 LAB — HIV ANTIBODY (ROUTINE TESTING W REFLEX): HIV Screen 4th Generation wRfx: NONREACTIVE

## 2019-04-24 SURGERY — EGD (ESOPHAGOGASTRODUODENOSCOPY)
Anesthesia: General

## 2019-04-24 NOTE — ED Notes (Signed)
Pt's O2 sat dec to 91% while sleeping. Placed on 2L; now 96%.

## 2019-04-24 NOTE — Progress Notes (Signed)
Patient left AMA, MD notified, IV removed.

## 2019-04-24 NOTE — ED Notes (Signed)
Called pharm as have messaged multiple times and still haven't received protonix continuous IV bag.

## 2019-05-04 ENCOUNTER — Other Ambulatory Visit: Payer: Self-pay

## 2019-05-04 ENCOUNTER — Ambulatory Visit (INDEPENDENT_AMBULATORY_CARE_PROVIDER_SITE_OTHER): Payer: Medicare Other | Admitting: Gastroenterology

## 2019-05-04 ENCOUNTER — Encounter: Payer: Self-pay | Admitting: Gastroenterology

## 2019-05-04 VITALS — BP 163/96 | HR 94 | Temp 98.7°F | Ht 76.0 in | Wt 216.4 lb

## 2019-05-04 DIAGNOSIS — R1310 Dysphagia, unspecified: Secondary | ICD-10-CM

## 2019-05-04 DIAGNOSIS — K920 Hematemesis: Secondary | ICD-10-CM

## 2019-05-04 DIAGNOSIS — R1319 Other dysphagia: Secondary | ICD-10-CM
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
## 2019-05-18 ENCOUNTER — Encounter: Payer: Self-pay | Admitting: Anesthesiology

## 2019-05-18 ENCOUNTER — Other Ambulatory Visit: Payer: Self-pay

## 2019-05-18 ENCOUNTER — Encounter: Payer: Self-pay | Admitting: Gastroenterology

## 2019-05-19 ENCOUNTER — Other Ambulatory Visit
Admission: RE | Admit: 2019-05-19 | Discharge: 2019-05-19 | Disposition: A | Discharge status: Home / Self Care | Payer: Medicare Other | Source: Ambulatory Visit | Attending: Gastroenterology | Admitting: Gastroenterology

## 2019-05-19 DIAGNOSIS — Z20822 Contact with and (suspected) exposure to covid-19: Secondary | ICD-10-CM | POA: Insufficient documentation

## 2019-05-19 DIAGNOSIS — Z01812 Encounter for preprocedural laboratory examination: Secondary | ICD-10-CM | POA: Insufficient documentation

## 2019-05-19 LAB — SARS CORONAVIRUS 2 (TAT 6-24 HRS): SARS Coronavirus 2: NEGATIVE

## 2019-05-20 ENCOUNTER — Encounter: Payer: Self-pay | Admitting: Emergency Medicine

## 2019-05-20 ENCOUNTER — Ambulatory Visit (INDEPENDENT_AMBULATORY_CARE_PROVIDER_SITE_OTHER): Payer: Medicare Other

## 2019-05-20 ENCOUNTER — Ambulatory Visit
Admission: EM | Admit: 2019-05-20 | Discharge: 2019-05-20 | Disposition: A | Discharge status: Home / Self Care | Payer: Medicare Other | Attending: Emergency Medicine | Admitting: Emergency Medicine

## 2019-05-20 ENCOUNTER — Ambulatory Visit: Payer: Medicare Other

## 2019-05-20 ENCOUNTER — Other Ambulatory Visit: Payer: Self-pay

## 2019-05-20 DIAGNOSIS — R0789 Other chest pain: Secondary | ICD-10-CM

## 2019-05-20 DIAGNOSIS — I10 Essential (primary) hypertension: Secondary | ICD-10-CM

## 2019-05-20 DIAGNOSIS — W11XXXA Fall on and from ladder, initial encounter: Secondary | ICD-10-CM

## 2019-05-20 DIAGNOSIS — R0781 Pleurodynia: Secondary | ICD-10-CM | POA: Diagnosis not present

## 2019-05-20 NOTE — Discharge Instructions (Signed)
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
## 2019-05-20 NOTE — ED Triage Notes (Signed)
Pt presents to [facility] for assessment after falling off a ladder (approx 10 feet in the air) on Tuesday of this week, hitting the ladder on the way down, then fell down on the ground.  Denies head injury, denies LOC.  C/o right rib pain since without relief with OTC remedies at home.  Patient c/o pain with deep inspiration.

## 2019-05-22 NOTE — Discharge Instructions (Signed)
General Anesthesia, Adult, Care After This sheet gives you information about how to care for yourself after your procedure. Your health care provider may also give you more specific instructions. If you have problems or questions, contact your health care provider. What can I expect after the procedure? After the procedure, the following side effects are common:  Pain or discomfort at the IV site.  Nausea.  Vomiting.  Sore throat.  Trouble concentrating.  Feeling cold or chills.  Weak or tired.  Sleepiness and fatigue.  Soreness and body aches. These side effects can affect parts of the body that were not involved in surgery. Follow these instructions at home:  For at least 24 hours after the procedure:  Have a responsible adult stay with you. It is important to have someone help care for you until you are awake and alert.  Rest as needed.  Do not: ? Participate in activities in which you could fall or become injured. ? Drive. ? Use heavy machinery. ? Drink alcohol. ? Take sleeping pills or medicines that cause drowsiness. ? Make important decisions or sign legal documents. ? Take care of children on your own. Eating and drinking  Follow any instructions from your health care provider about eating or drinking restrictions.  When you feel hungry, start by eating small amounts of foods that are soft and easy to digest (bland), such as toast. Gradually return to your regular diet.  Drink enough fluid to keep your urine pale yellow.  If you vomit, rehydrate by drinking water, juice, or clear broth. General instructions  If you have sleep apnea, surgery and certain medicines can increase your risk for breathing problems. Follow instructions from your health care provider about wearing your sleep device: ? Anytime you are sleeping, including during daytime naps. ? While taking prescription pain medicines, sleeping medicines, or medicines that make you drowsy.  Return to  your normal activities as told by your health care provider. Ask your health care provider what activities are safe for you.  Take over-the-counter and prescription medicines only as told by your health care provider.  If you smoke, do not smoke without supervision.  Keep all follow-up visits as told by your health care provider. This is important. Contact a health care provider if:  You have nausea or vomiting that does not get better with medicine.  You cannot eat or drink without vomiting.  You have pain that does not get better with medicine.  You are unable to pass urine.  You develop a skin rash.  You have a fever.  You have redness around your IV site that gets worse. Get help right away if:  You have difficulty breathing.  You have chest pain.  You have blood in your urine or stool, or you vomit blood. Summary  After the procedure, it is common to have a sore throat or nausea. It is also common to feel tired.  Have a responsible adult stay with you for the first 24 hours after general anesthesia. It is important to have someone help care for you until you are awake and alert.  When you feel hungry, start by eating small amounts of foods that are soft and easy to digest (bland), such as toast. Gradually return to your regular diet.  Drink enough fluid to keep your urine pale yellow.  Return to your normal activities as told by your health care provider. Ask your health care provider what activities are safe for you. This information is not   intended to replace advice given to you by your health care provider. Make sure you discuss any questions you have with your health care provider. Document Revised: 04/09/2017 Document Reviewed: 11/20/2016 Elsevier Patient Education  2020 [employer]  

## 2019-05-23 ENCOUNTER — Ambulatory Visit: Admission: RE | Admit: 2019-05-23 | Payer: Medicare Other | Source: Home / Self Care | Admitting: Gastroenterology

## 2019-05-23 HISTORY — DX: Migraine, unspecified, not intractable, without status migrainosus: G43.909

## 2019-05-23 SURGERY — ESOPHAGOGASTRODUODENOSCOPY (EGD) WITH PROPOFOL
Anesthesia: Choice

## 2020-01-06 ENCOUNTER — Emergency Department (HOSPITAL_COMMUNITY): Payer: Medicare Other

## 2020-01-06 ENCOUNTER — Inpatient Hospital Stay (HOSPITAL_COMMUNITY)
Admission: EM | Admit: 2020-01-06 | Discharge: 2020-01-07 | DRG: 917 | Disposition: A | Discharge status: Home / Self Care | Payer: Medicare Other | Attending: Pulmonary Disease | Admitting: Pulmonary Disease

## 2020-01-06 ENCOUNTER — Encounter (HOSPITAL_COMMUNITY): Payer: Self-pay

## 2020-01-06 ENCOUNTER — Other Ambulatory Visit: Payer: Self-pay

## 2020-01-06 DIAGNOSIS — I10 Essential (primary) hypertension: Secondary | ICD-10-CM | POA: Diagnosis present

## 2020-01-06 DIAGNOSIS — F17210 Nicotine dependence, cigarettes, uncomplicated: Secondary | ICD-10-CM | POA: Diagnosis present

## 2020-01-06 DIAGNOSIS — G8321 Monoplegia of upper limb affecting right dominant side: Secondary | ICD-10-CM | POA: Diagnosis present

## 2020-01-06 DIAGNOSIS — Z20822 Contact with and (suspected) exposure to covid-19: Secondary | ICD-10-CM | POA: Diagnosis present

## 2020-01-06 DIAGNOSIS — G9349 Other encephalopathy: Secondary | ICD-10-CM | POA: Diagnosis present

## 2020-01-06 DIAGNOSIS — E119 Type 2 diabetes mellitus without complications: Secondary | ICD-10-CM | POA: Diagnosis present

## 2020-01-06 DIAGNOSIS — Z808 Family history of malignant neoplasm of other organs or systems: Secondary | ICD-10-CM | POA: Diagnosis not present

## 2020-01-06 DIAGNOSIS — T40601A Poisoning by unspecified narcotics, accidental (unintentional), initial encounter: Secondary | ICD-10-CM | POA: Diagnosis not present

## 2020-01-06 DIAGNOSIS — Z8249 Family history of ischemic heart disease and other diseases of the circulatory system: Secondary | ICD-10-CM | POA: Diagnosis not present

## 2020-01-06 DIAGNOSIS — M1990 Unspecified osteoarthritis, unspecified site: Secondary | ICD-10-CM | POA: Diagnosis present

## 2020-01-06 DIAGNOSIS — T50901D Poisoning by unspecified drugs, medicaments and biological substances, accidental (unintentional), subsequent encounter: Secondary | ICD-10-CM

## 2020-01-06 DIAGNOSIS — G43909 Migraine, unspecified, not intractable, without status migrainosus: Secondary | ICD-10-CM | POA: Diagnosis present

## 2020-01-06 DIAGNOSIS — Z820 Family history of epilepsy and other diseases of the nervous system: Secondary | ICD-10-CM

## 2020-01-06 DIAGNOSIS — T50901A Poisoning by unspecified drugs, medicaments and biological substances, accidental (unintentional), initial encounter: Secondary | ICD-10-CM | POA: Diagnosis present

## 2020-01-06 DIAGNOSIS — S06339A Contusion and laceration of cerebrum, unspecified, with loss of consciousness of unspecified duration, initial encounter: Secondary | ICD-10-CM | POA: Diagnosis present

## 2020-01-06 DIAGNOSIS — Y92410 Unspecified street and highway as the place of occurrence of the external cause: Secondary | ICD-10-CM

## 2020-01-06 DIAGNOSIS — Z888 Allergy status to other drugs, medicaments and biological substances status: Secondary | ICD-10-CM

## 2020-01-06 DIAGNOSIS — Z79899 Other long term (current) drug therapy: Secondary | ICD-10-CM | POA: Diagnosis not present

## 2020-01-06 DIAGNOSIS — G9340 Encephalopathy, unspecified: Secondary | ICD-10-CM | POA: Diagnosis not present

## 2020-01-06 LAB — GLUCOSE, CAPILLARY
Glucose-Capillary: 149 mg/dL — ABNORMAL HIGH (ref 70–99)
Glucose-Capillary: 151 mg/dL — ABNORMAL HIGH (ref 70–99)
Glucose-Capillary: 167 mg/dL — ABNORMAL HIGH (ref 70–99)
Glucose-Capillary: 193 mg/dL — ABNORMAL HIGH (ref 70–99)
Glucose-Capillary: 204 mg/dL — ABNORMAL HIGH (ref 70–99)

## 2020-01-06 LAB — ETHANOL: Alcohol, Ethyl (B): <10 mg/dL (ref <10)

## 2020-01-06 LAB — MRSA PCR SCREENING: MRSA by PCR: POSITIVE — AB

## 2020-01-06 LAB — COMPREHENSIVE METABOLIC PANEL
ALT: 35 U/L (ref 0–44)
AST: 27 U/L (ref 15–41)
Albumin: 4.2 g/dL (ref 3.5–5.0)
Alkaline Phosphatase: 89 U/L (ref 38–126)
Anion gap: 11 (ref 5–15)
BUN: 13 mg/dL (ref 6–20)
CO2: 26 mmol/L (ref 22–32)
Calcium: 9.1 mg/dL (ref 8.9–10.3)
Chloride: 102 mmol/L (ref 98–111)
Creatinine, Ser: 0.86 mg/dL (ref 0.61–1.24)
GFR calc Af Amer: >60 mL/min (ref >60)
GFR calc non Af Amer: >60 mL/min (ref >60)
Glucose, Bld: 185 mg/dL — ABNORMAL HIGH (ref 70–99)
Potassium: 4.6 mmol/L (ref 3.5–5.1)
Sodium: 139 mmol/L (ref 135–145)
Total Bilirubin: 0.6 mg/dL (ref 0.3–1.2)
Total Protein: 7.7 g/dL (ref 6.5–8.1)

## 2020-01-06 LAB — CBC WITH DIFFERENTIAL/PLATELET
Abs Immature Granulocytes: 0.04 10*3/uL (ref 0.00–0.07)
Basophils Absolute: 0.1 10*3/uL (ref 0.0–0.1)
Basophils Relative: 1 %
Eosinophils Absolute: 0.2 10*3/uL (ref 0.0–0.5)
Eosinophils Relative: 1 %
HCT: 45.4 % (ref 39.0–52.0)
Hemoglobin: 14.8 g/dL (ref 13.0–17.0)
Immature Granulocytes: 0 %
Lymphocytes Relative: 10 %
Lymphs Abs: 1.2 10*3/uL (ref 0.7–4.0)
MCH: 30.5 pg (ref 26.0–34.0)
MCHC: 32.6 g/dL (ref 30.0–36.0)
MCV: 93.4 fL (ref 80.0–100.0)
Monocytes Absolute: 0.8 10*3/uL (ref 0.1–1.0)
Monocytes Relative: 7 %
Neutro Abs: 9.4 10*3/uL — ABNORMAL HIGH (ref 1.7–7.7)
Neutrophils Relative %: 81 %
Platelets: 204 10*3/uL (ref 150–400)
RBC: 4.86 MIL/uL (ref 4.22–5.81)
RDW: 13.8 % (ref 11.5–15.5)
WBC: 11.6 10*3/uL — ABNORMAL HIGH (ref 4.0–10.5)
nRBC: 0 % (ref 0.0–0.2)

## 2020-01-06 LAB — RAPID URINE DRUG SCREEN, HOSP PERFORMED
Amphetamines: POSITIVE — AB
Barbiturates: NOT DETECTED
Benzodiazepines: POSITIVE — AB
Cocaine: NOT DETECTED
Opiates: POSITIVE — AB
Tetrahydrocannabinol: NOT DETECTED

## 2020-01-06 LAB — SARS CORONAVIRUS 2 BY RT PCR (HOSPITAL ORDER, PERFORMED IN ~~LOC~~ HOSPITAL LAB): SARS Coronavirus 2: NEGATIVE

## 2020-01-06 LAB — HEMOGLOBIN A1C
Hgb A1c MFr Bld: 6.5 % — ABNORMAL HIGH (ref 4.8–5.6)
Mean Plasma Glucose: 139.85 mg/dL

## 2020-01-06 MED ORDER — HYDRALAZINE HCL 20 MG/ML IJ SOLN
5.0000 mg | INTRAMUSCULAR | Status: DC | PRN
  Administered 2020-01-06: 5 mg via INTRAVENOUS
  Filled 2020-01-06: qty 1

## 2020-01-06 MED ORDER — INSULIN ASPART 100 UNIT/ML ~~LOC~~ SOLN
0.0000 [IU] | SUBCUTANEOUS | Status: DC
  Administered 2020-01-06: 5 [IU] via SUBCUTANEOUS
  Administered 2020-01-06: 3 [IU] via SUBCUTANEOUS
  Administered 2020-01-06: 2 [IU] via SUBCUTANEOUS
  Administered 2020-01-07: 3 [IU] via SUBCUTANEOUS
  Administered 2020-01-07 (×2): 2 [IU] via SUBCUTANEOUS

## 2020-01-06 MED ORDER — NALOXONE HCL 2 MG/2ML IJ SOSY
2.0000 mg | PREFILLED_SYRINGE | INTRAMUSCULAR | Status: DC | PRN
  Filled 2020-01-06: qty 2

## 2020-01-06 MED ORDER — NALOXONE HCL 2 MG/2ML IJ SOSY
PREFILLED_SYRINGE | INTRAMUSCULAR | Status: AC
  Administered 2020-01-06: 2 mg via INTRAVENOUS
  Filled 2020-01-06: qty 2

## 2020-01-06 MED ORDER — CLONAZEPAM 1 MG PO TABS
1.0000 mg | ORAL_TABLET | Freq: Three times a day (TID) | ORAL | Status: DC
  Administered 2020-01-06 – 2020-01-07 (×2): 1 mg via ORAL
  Filled 2020-01-06 (×2): qty 1

## 2020-01-06 MED ORDER — LORAZEPAM 2 MG/ML IJ SOLN
INTRAMUSCULAR | Status: AC
  Administered 2020-01-06: 2 mg via INTRAVENOUS
  Filled 2020-01-06: qty 1

## 2020-01-06 MED ORDER — LIP MEDEX EX OINT
TOPICAL_OINTMENT | CUTANEOUS | Status: DC | PRN
  Filled 2020-01-06: qty 7

## 2020-01-06 MED ORDER — NALOXONE HCL 0.4 MG/ML IJ SOLN
INTRAMUSCULAR | Status: AC
  Filled 2020-01-06: qty 1

## 2020-01-06 MED ORDER — GABAPENTIN 400 MG PO CAPS
800.0000 mg | ORAL_CAPSULE | Freq: Three times a day (TID) | ORAL | Status: DC
  Administered 2020-01-06 – 2020-01-07 (×3): 800 mg via ORAL
  Filled 2020-01-06 (×3): qty 2

## 2020-01-06 MED ORDER — CHLORHEXIDINE GLUCONATE CLOTH 2 % EX PADS
6.0000 | MEDICATED_PAD | Freq: Every day | CUTANEOUS | Status: DC
  Administered 2020-01-06: 6 via TOPICAL

## 2020-01-06 MED ORDER — CHLORHEXIDINE GLUCONATE 0.12 % MT SOLN
15.0000 mL | Freq: Two times a day (BID) | OROMUCOSAL | Status: DC
  Administered 2020-01-06 – 2020-01-07 (×2): 15 mL via OROMUCOSAL
  Filled 2020-01-06 (×2): qty 15

## 2020-01-06 MED ORDER — OXYCODONE HCL 5 MG PO TABS
15.0000 mg | ORAL_TABLET | Freq: Four times a day (QID) | ORAL | Status: DC | PRN
  Administered 2020-01-06: 15 mg via ORAL
  Filled 2020-01-06: qty 3

## 2020-01-06 MED ORDER — POLYETHYLENE GLYCOL 3350 17 G PO PACK: 17.0000 g | PACK | Freq: Every day | ORAL | Status: DC | PRN

## 2020-01-06 MED ORDER — AMLODIPINE BESYLATE 5 MG PO TABS
5.0000 mg | ORAL_TABLET | Freq: Every day | ORAL | Status: DC
  Administered 2020-01-06 – 2020-01-07 (×2): 5 mg via ORAL
  Filled 2020-01-06 (×2): qty 1

## 2020-01-06 MED ORDER — LORAZEPAM 2 MG/ML IJ SOLN: 2.0000 mg | Freq: Four times a day (QID) | INTRAMUSCULAR | Status: DC | PRN

## 2020-01-06 MED ORDER — MUPIROCIN 2 % EX OINT
1.0000 "application " | TOPICAL_OINTMENT | Freq: Two times a day (BID) | CUTANEOUS | Status: DC
  Administered 2020-01-06 – 2020-01-07 (×3): 1 via NASAL
  Filled 2020-01-06: qty 22

## 2020-01-06 MED ORDER — NALOXONE HCL 4 MG/10ML IJ SOLN
1.0000 mg/h | INTRAVENOUS | Status: DC
  Administered 2020-01-06: 1 mg/h via INTRAVENOUS
  Filled 2020-01-06 (×2): qty 10

## 2020-01-06 MED ORDER — HYDROCHLOROTHIAZIDE 25 MG PO TABS
25.0000 mg | ORAL_TABLET | Freq: Every day | ORAL | Status: DC
  Administered 2020-01-06 – 2020-01-07 (×2): 25 mg via ORAL
  Filled 2020-01-06 (×2): qty 1

## 2020-01-06 MED ORDER — ORAL CARE MOUTH RINSE
15.0000 mL | Freq: Two times a day (BID) | OROMUCOSAL | Status: DC
  Administered 2020-01-06: 15 mL via OROMUCOSAL

## 2020-01-06 MED ORDER — GLIMEPIRIDE 4 MG PO TABS
4.0000 mg | ORAL_TABLET | Freq: Every day | ORAL | Status: DC
  Administered 2020-01-07: 4 mg via ORAL
  Filled 2020-01-06: qty 1

## 2020-01-06 MED ORDER — HEPARIN SODIUM (PORCINE) 5000 UNIT/ML IJ SOLN
5000.0000 [IU] | Freq: Three times a day (TID) | INTRAMUSCULAR | Status: DC
  Administered 2020-01-06 – 2020-01-07 (×2): 5000 [IU] via SUBCUTANEOUS
  Filled 2020-01-06 (×3): qty 1

## 2020-01-06 MED ORDER — LORAZEPAM 2 MG/ML IJ SOLN: 2.0000 mg | Freq: Once | INTRAMUSCULAR | Status: AC

## 2020-01-06 MED ORDER — DOCUSATE SODIUM 100 MG PO CAPS: 100.0000 mg | ORAL_CAPSULE | Freq: Two times a day (BID) | ORAL | Status: DC | PRN

## 2020-01-06 MED ORDER — ACETAMINOPHEN 325 MG PO TABS
650.0000 mg | ORAL_TABLET | Freq: Four times a day (QID) | ORAL | Status: DC | PRN
  Administered 2020-01-06: 650 mg via ORAL
  Filled 2020-01-06: qty 2

## 2020-01-06 MED ORDER — METOPROLOL TARTRATE 5 MG/5ML IV SOLN: 5.0000 mg | Freq: Four times a day (QID) | INTRAVENOUS | Status: DC
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
## 2020-01-06 NOTE — ED Notes (Signed)
Pt given Narcan to to RR being 7-9 despite prior Narcan administration. Saturations dropping and pt somnolent prior to [employer]. After Narcan admin, doctor notified due to pt having seizure-like activity. See order. Ativan administered while MD at bedside

## 2020-01-06 NOTE — ED Notes (Signed)
ICU called to give report and no answer
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
## 2020-01-06 NOTE — ED Notes (Signed)
ICU called to give report and nurse is to call back

## 2020-01-07 DIAGNOSIS — T50901D Poisoning by unspecified drugs, medicaments and biological substances, accidental (unintentional), subsequent encounter: Secondary | ICD-10-CM

## 2020-01-07 LAB — PHOSPHORUS: Phosphorus: 3.7 mg/dL (ref 2.5–4.6)

## 2020-01-07 LAB — BASIC METABOLIC PANEL
Anion gap: 10 (ref 5–15)
BUN: 15 mg/dL (ref 6–20)
CO2: 27 mmol/L (ref 22–32)
Calcium: 9.4 mg/dL (ref 8.9–10.3)
Chloride: 98 mmol/L (ref 98–111)
Creatinine, Ser: 0.81 mg/dL (ref 0.61–1.24)
GFR calc Af Amer: >60 mL/min (ref >60)
GFR calc non Af Amer: >60 mL/min (ref >60)
Glucose, Bld: 120 mg/dL — ABNORMAL HIGH (ref 70–99)
Potassium: 4.2 mmol/L (ref 3.5–5.1)
Sodium: 135 mmol/L (ref 135–145)

## 2020-01-07 LAB — CBC
HCT: 49.3 % (ref 39.0–52.0)
Hemoglobin: 15.6 g/dL (ref 13.0–17.0)
MCH: 29.5 pg (ref 26.0–34.0)
MCHC: 31.6 g/dL (ref 30.0–36.0)
MCV: 93.4 fL (ref 80.0–100.0)
Platelets: 237 10*3/uL (ref 150–400)
RBC: 5.28 MIL/uL (ref 4.22–5.81)
RDW: 14 % (ref 11.5–15.5)
WBC: 7.1 10*3/uL (ref 4.0–10.5)
nRBC: 0 % (ref 0.0–0.2)

## 2020-01-07 LAB — MAGNESIUM: Magnesium: 2 mg/dL (ref 1.7–2.4)

## 2020-01-07 LAB — GLUCOSE, CAPILLARY
Glucose-Capillary: 125 mg/dL — ABNORMAL HIGH (ref 70–99)
Glucose-Capillary: 136 mg/dL — ABNORMAL HIGH (ref 70–99)

## 2020-01-07 MED ORDER — CLONAZEPAM 1 MG PO TABS
1.0000 mg | ORAL_TABLET | Freq: Two times a day (BID) | ORAL | 0 refills | Status: DC
Start: 2020-01-07 — End: 2023-11-04
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
## 2020-01-07 NOTE — Plan of Care (Signed)

## 2021-09-29 ENCOUNTER — Emergency Department: Payer: Medicare Other

## 2021-09-29 ENCOUNTER — Inpatient Hospital Stay
Admission: EM | Admit: 2021-09-29 | Discharge: 2021-10-02 | DRG: 690 | Disposition: A | Discharge status: Home / Self Care | Payer: Medicare Other | Attending: Internal Medicine | Admitting: Internal Medicine

## 2021-09-29 ENCOUNTER — Other Ambulatory Visit: Payer: Self-pay

## 2021-09-29 ENCOUNTER — Encounter: Payer: Self-pay | Admitting: Emergency Medicine

## 2021-09-29 DIAGNOSIS — Z79899 Other long term (current) drug therapy: Secondary | ICD-10-CM | POA: Diagnosis not present

## 2021-09-29 DIAGNOSIS — B379 Candidiasis, unspecified: Secondary | ICD-10-CM | POA: Diagnosis present

## 2021-09-29 DIAGNOSIS — N10 Acute tubulo-interstitial nephritis: Principal | ICD-10-CM

## 2021-09-29 DIAGNOSIS — R739 Hyperglycemia, unspecified: Secondary | ICD-10-CM

## 2021-09-29 DIAGNOSIS — G8321 Monoplegia of upper limb affecting right dominant side: Secondary | ICD-10-CM | POA: Diagnosis present

## 2021-09-29 DIAGNOSIS — R109 Unspecified abdominal pain: Principal | ICD-10-CM

## 2021-09-29 DIAGNOSIS — Z6826 Body mass index (BMI) 26.0-26.9, adult: Secondary | ICD-10-CM

## 2021-09-29 DIAGNOSIS — Z87442 Personal history of urinary calculi: Secondary | ICD-10-CM

## 2021-09-29 DIAGNOSIS — I161 Hypertensive emergency: Secondary | ICD-10-CM

## 2021-09-29 DIAGNOSIS — G43909 Migraine, unspecified, not intractable, without status migrainosus: Secondary | ICD-10-CM | POA: Diagnosis present

## 2021-09-29 DIAGNOSIS — K5900 Constipation, unspecified: Secondary | ICD-10-CM | POA: Diagnosis present

## 2021-09-29 DIAGNOSIS — E119 Type 2 diabetes mellitus without complications: Secondary | ICD-10-CM

## 2021-09-29 DIAGNOSIS — I10 Essential (primary) hypertension: Secondary | ICD-10-CM

## 2021-09-29 DIAGNOSIS — G8929 Other chronic pain: Secondary | ICD-10-CM

## 2021-09-29 DIAGNOSIS — Z888 Allergy status to other drugs, medicaments and biological substances status: Secondary | ICD-10-CM | POA: Diagnosis not present

## 2021-09-29 DIAGNOSIS — B370 Candidal stomatitis: Secondary | ICD-10-CM

## 2021-09-29 DIAGNOSIS — E1165 Type 2 diabetes mellitus with hyperglycemia: Secondary | ICD-10-CM | POA: Diagnosis present

## 2021-09-29 DIAGNOSIS — E440 Moderate protein-calorie malnutrition: Secondary | ICD-10-CM | POA: Insufficient documentation

## 2021-09-29 DIAGNOSIS — N12 Tubulo-interstitial nephritis, not specified as acute or chronic: Secondary | ICD-10-CM

## 2021-09-29 DIAGNOSIS — Z87820 Personal history of traumatic brain injury: Secondary | ICD-10-CM

## 2021-09-29 DIAGNOSIS — Z7984 Long term (current) use of oral hypoglycemic drugs: Secondary | ICD-10-CM

## 2021-09-29 DIAGNOSIS — I160 Hypertensive urgency: Secondary | ICD-10-CM | POA: Diagnosis present

## 2021-09-29 DIAGNOSIS — E43 Unspecified severe protein-calorie malnutrition: Secondary | ICD-10-CM | POA: Insufficient documentation

## 2021-09-29 LAB — COMPREHENSIVE METABOLIC PANEL
ALT: 57 U/L — ABNORMAL HIGH (ref 0–44)
AST: 40 U/L (ref 15–41)
Albumin: 4.4 g/dL (ref 3.5–5.0)
Alkaline Phosphatase: 193 U/L — ABNORMAL HIGH (ref 38–126)
Anion gap: 15 (ref 5–15)
BUN: 18 mg/dL (ref 6–20)
CO2: 22 mmol/L (ref 22–32)
Calcium: 9 mg/dL (ref 8.9–10.3)
Chloride: 88 mmol/L — ABNORMAL LOW (ref 98–111)
Creatinine, Ser: 1.36 mg/dL — ABNORMAL HIGH (ref 0.61–1.24)
GFR, Estimated: >60 mL/min (ref >60)
Glucose, Bld: 691 mg/dL (ref 70–99)
Potassium: 4.5 mmol/L (ref 3.5–5.1)
Sodium: 125 mmol/L — ABNORMAL LOW (ref 135–145)
Total Bilirubin: 1.4 mg/dL — ABNORMAL HIGH (ref 0.3–1.2)
Total Protein: 8.4 g/dL — ABNORMAL HIGH (ref 6.5–8.1)

## 2021-09-29 LAB — BASIC METABOLIC PANEL
Anion gap: 12 (ref 5–15)
Anion gap: 8 (ref 5–15)
Anion gap: 9 (ref 5–15)
BUN: 17 mg/dL (ref 6–20)
BUN: 16 mg/dL (ref 6–20)
BUN: 17 mg/dL (ref 6–20)
CO2: 23 mmol/L (ref 22–32)
CO2: 25 mmol/L (ref 22–32)
CO2: 28 mmol/L (ref 22–32)
Calcium: 8.9 mg/dL (ref 8.9–10.3)
Calcium: 8.9 mg/dL (ref 8.9–10.3)
Calcium: 8.8 mg/dL — ABNORMAL LOW (ref 8.9–10.3)
Chloride: 92 mmol/L — ABNORMAL LOW (ref 98–111)
Chloride: 102 mmol/L (ref 98–111)
Chloride: 100 mmol/L (ref 98–111)
Creatinine, Ser: 1.11 mg/dL (ref 0.61–1.24)
Creatinine, Ser: 0.96 mg/dL (ref 0.61–1.24)
Creatinine, Ser: 1.13 mg/dL (ref 0.61–1.24)
GFR, Estimated: >60 mL/min (ref >60)
GFR, Estimated: >60 mL/min (ref >60)
GFR, Estimated: >60 mL/min (ref >60)
Glucose, Bld: 589 mg/dL (ref 70–99)
Glucose, Bld: 236 mg/dL — ABNORMAL HIGH (ref 70–99)
Glucose, Bld: 174 mg/dL — ABNORMAL HIGH (ref 70–99)
Potassium: 4.5 mmol/L (ref 3.5–5.1)
Potassium: 3.5 mmol/L (ref 3.5–5.1)
Potassium: 4 mmol/L (ref 3.5–5.1)
Sodium: 127 mmol/L — ABNORMAL LOW (ref 135–145)
Sodium: 135 mmol/L (ref 135–145)
Sodium: 137 mmol/L (ref 135–145)

## 2021-09-29 LAB — URINALYSIS, ROUTINE W REFLEX MICROSCOPIC
Bacteria, UA: NONE SEEN
Bilirubin Urine: NEGATIVE
Glucose, UA: >=500 mg/dL — AB
Ketones, ur: 5 mg/dL — AB
Leukocytes,Ua: NEGATIVE
Nitrite: NEGATIVE
Protein, ur: 100 mg/dL — AB
RBC / HPF: >50 RBC/hpf — ABNORMAL HIGH (ref 0–5)
Specific Gravity, Urine: >1.046 — ABNORMAL HIGH (ref 1.005–1.030)
pH: 5 (ref 5.0–8.0)

## 2021-09-29 LAB — CBC WITH DIFFERENTIAL/PLATELET
Abs Immature Granulocytes: 0.07 10*3/uL (ref 0.00–0.07)
Basophils Absolute: 0.1 10*3/uL (ref 0.0–0.1)
Basophils Relative: 0 %
Eosinophils Absolute: 0 10*3/uL (ref 0.0–0.5)
Eosinophils Relative: 0 %
HCT: 49.9 % (ref 39.0–52.0)
Hemoglobin: 17.4 g/dL — ABNORMAL HIGH (ref 13.0–17.0)
Immature Granulocytes: 1 %
Lymphocytes Relative: 7 %
Lymphs Abs: 1 10*3/uL (ref 0.7–4.0)
MCH: 30 pg (ref 26.0–34.0)
MCHC: 34.9 g/dL (ref 30.0–36.0)
MCV: 86 fL (ref 80.0–100.0)
Monocytes Absolute: 0.8 10*3/uL (ref 0.1–1.0)
Monocytes Relative: 6 %
Neutro Abs: 12.3 10*3/uL — ABNORMAL HIGH (ref 1.7–7.7)
Neutrophils Relative %: 86 %
Platelets: 163 10*3/uL (ref 150–400)
RBC: 5.8 MIL/uL (ref 4.22–5.81)
RDW: 12.1 % (ref 11.5–15.5)
WBC: 14.2 10*3/uL — ABNORMAL HIGH (ref 4.0–10.5)
nRBC: 0 % (ref 0.0–0.2)

## 2021-09-29 LAB — CBG MONITORING, ED
Glucose-Capillary: 158 mg/dL — ABNORMAL HIGH (ref 70–99)
Glucose-Capillary: 181 mg/dL — ABNORMAL HIGH (ref 70–99)
Glucose-Capillary: 189 mg/dL — ABNORMAL HIGH (ref 70–99)
Glucose-Capillary: 290 mg/dL — ABNORMAL HIGH (ref 70–99)
Glucose-Capillary: 317 mg/dL — ABNORMAL HIGH (ref 70–99)
Glucose-Capillary: 341 mg/dL — ABNORMAL HIGH (ref 70–99)

## 2021-09-29 LAB — TROPONIN I (HIGH SENSITIVITY)
Troponin I (High Sensitivity): 53 ng/L — ABNORMAL HIGH (ref <18)
Troponin I (High Sensitivity): 59 ng/L — ABNORMAL HIGH (ref <18)

## 2021-09-29 LAB — CBC
HCT: 51.9 % (ref 39.0–52.0)
Hemoglobin: 17.8 g/dL — ABNORMAL HIGH (ref 13.0–17.0)
MCH: 29.6 pg (ref 26.0–34.0)
MCHC: 34.3 g/dL (ref 30.0–36.0)
MCV: 86.4 fL (ref 80.0–100.0)
Platelets: 179 10*3/uL (ref 150–400)
RBC: 6.01 MIL/uL — ABNORMAL HIGH (ref 4.22–5.81)
RDW: 12.3 % (ref 11.5–15.5)
WBC: 15.1 10*3/uL — ABNORMAL HIGH (ref 4.0–10.5)
nRBC: 0 % (ref 0.0–0.2)

## 2021-09-29 LAB — BLOOD GAS, VENOUS
Acid-Base Excess: 1 mmol/L (ref 0.0–2.0)
Bicarbonate: 26 mmol/L (ref 20.0–28.0)
O2 Saturation: 97.1 %
Patient temperature: 37
pCO2, Ven: 42 mmHg — ABNORMAL LOW (ref 44–60)
pH, Ven: 7.4 (ref 7.25–7.43)
pO2, Ven: 75 mmHg — ABNORMAL HIGH (ref 32–45)

## 2021-09-29 LAB — BETA-HYDROXYBUTYRIC ACID
Beta-Hydroxybutyric Acid: 1.7 mmol/L — ABNORMAL HIGH (ref 0.05–0.27)
Beta-Hydroxybutyric Acid: 0.07 mmol/L (ref 0.05–0.27)

## 2021-09-29 LAB — HIV ANTIBODY (ROUTINE TESTING W REFLEX): HIV Screen 4th Generation wRfx: NONREACTIVE

## 2021-09-29 LAB — LIPASE, BLOOD: Lipase: 46 U/L (ref 11–51)

## 2021-09-29 LAB — LACTIC ACID, PLASMA
Lactic Acid, Venous: 1.8 mmol/L (ref 0.5–1.9)
Lactic Acid, Venous: 2.3 mmol/L (ref 0.5–1.9)

## 2021-09-29 MED ORDER — ONDANSETRON 4 MG PO TBDP
8.0000 mg | ORAL_TABLET | Freq: Two times a day (BID) | ORAL | Status: DC | PRN
Start: 2021-09-29 — End: 2021-10-02

## 2021-09-29 MED ORDER — GABAPENTIN 300 MG PO CAPS
300.0000 mg | ORAL_CAPSULE | Freq: Three times a day (TID) | ORAL | Status: DC
  Administered 2021-09-29 – 2021-10-01 (×7): 300 mg via ORAL
  Filled 2021-09-29 (×7): qty 1

## 2021-09-29 MED ORDER — LACTATED RINGERS IV SOLN: INTRAVENOUS | Status: DC

## 2021-09-29 MED ORDER — METOPROLOL TARTRATE 25 MG PO TABS: 12.5000 mg | ORAL_TABLET | ORAL | Status: DC | PRN

## 2021-09-29 MED ORDER — CEFTRIAXONE SODIUM 2 G IJ SOLR
2.0000 g | INTRAMUSCULAR | Status: DC
  Administered 2021-09-30 – 2021-10-01 (×2): 2 g via INTRAVENOUS
  Filled 2021-09-29: qty 20
  Filled 2021-09-29: qty 2

## 2021-09-29 MED ORDER — ONDANSETRON HCL 4 MG/2ML IJ SOLN
4.0000 mg | Freq: Once | INTRAMUSCULAR | Status: AC
  Administered 2021-09-29: 4 mg via INTRAVENOUS
  Filled 2021-09-29: qty 2

## 2021-09-29 MED ORDER — POTASSIUM CHLORIDE 10 MEQ/100ML IV SOLN
10.0000 meq | INTRAVENOUS | Status: AC
  Administered 2021-09-29: 10 meq via INTRAVENOUS
  Filled 2021-09-29 (×2): qty 100

## 2021-09-29 MED ORDER — ACETAMINOPHEN 650 MG RE SUPP: 650.0000 mg | Freq: Four times a day (QID) | RECTAL | Status: DC | PRN

## 2021-09-29 MED ORDER — INSULIN GLARGINE-YFGN 100 UNIT/ML ~~LOC~~ SOLN
10.0000 [IU] | Freq: Every day | SUBCUTANEOUS | Status: DC
  Administered 2021-09-29: 10 [IU] via SUBCUTANEOUS
  Filled 2021-09-29: qty 0.1

## 2021-09-29 MED ORDER — CLONAZEPAM 1 MG PO TABS
1.0000 mg | ORAL_TABLET | Freq: Two times a day (BID) | ORAL | Status: DC
  Administered 2021-09-29 – 2021-09-30 (×2): 1 mg via ORAL
  Filled 2021-09-29: qty 2
  Filled 2021-09-29: qty 1

## 2021-09-29 MED ORDER — IOHEXOL 350 MG/ML SOLN
100.0000 mL | Freq: Once | INTRAVENOUS | Status: AC | PRN
  Administered 2021-09-29: 100 mL via INTRAVENOUS

## 2021-09-29 MED ORDER — POLYETHYLENE GLYCOL 3350 17 G PO PACK
17.0000 g | PACK | Freq: Every day | ORAL | Status: DC
  Administered 2021-09-29 – 2021-10-01 (×3): 17 g via ORAL
  Filled 2021-09-29 (×4): qty 1

## 2021-09-29 MED ORDER — FAMOTIDINE 20 MG PO TABS: 20.0000 mg | ORAL_TABLET | Freq: Two times a day (BID) | ORAL | Status: DC | PRN

## 2021-09-29 MED ORDER — LOSARTAN POTASSIUM 50 MG PO TABS: 100.0000 mg | ORAL_TABLET | ORAL | Status: DC

## 2021-09-29 MED ORDER — POTASSIUM CHLORIDE 10 MEQ/100ML IV SOLN
INTRAVENOUS | Status: AC
  Administered 2021-09-29: 10 meq via INTRAVENOUS
  Filled 2021-09-29: qty 100

## 2021-09-29 MED ORDER — DEXTROSE 50 % IV SOLN: 0.0000 mL | INTRAVENOUS | Status: DC | PRN

## 2021-09-29 MED ORDER — PROMETHAZINE HCL 25 MG PO TABS
25.0000 mg | ORAL_TABLET | ORAL | Status: DC | PRN
Start: 2021-09-29 — End: 2021-10-02

## 2021-09-29 MED ORDER — HYDROMORPHONE HCL 1 MG/ML IJ SOLN
1.0000 mg | Freq: Once | INTRAMUSCULAR | Status: AC
  Administered 2021-09-29: 1 mg via INTRAVENOUS
  Filled 2021-09-29: qty 1

## 2021-09-29 MED ORDER — HYDROCHLOROTHIAZIDE 25 MG PO TABS
25.0000 mg | ORAL_TABLET | Freq: Every day | ORAL | Status: DC
  Administered 2021-09-29 – 2021-09-30 (×2): 25 mg via ORAL
  Filled 2021-09-29 (×2): qty 1

## 2021-09-29 MED ORDER — ACETAMINOPHEN 325 MG PO TABS
650.0000 mg | ORAL_TABLET | Freq: Four times a day (QID) | ORAL | Status: DC | PRN
  Administered 2021-09-30 – 2021-10-02 (×2): 650 mg via ORAL
  Filled 2021-09-29 (×2): qty 2

## 2021-09-29 MED ORDER — SODIUM CHLORIDE 0.9 % IV BOLUS
1000.0000 mL | Freq: Once | INTRAVENOUS | Status: AC
  Administered 2021-09-29: 1000 mL via INTRAVENOUS

## 2021-09-29 MED ORDER — SODIUM CHLORIDE 0.9 % IV SOLN
2.0000 g | Freq: Once | INTRAVENOUS | Status: AC
  Administered 2021-09-29: 2 g via INTRAVENOUS
  Filled 2021-09-29: qty 20

## 2021-09-29 MED ORDER — ENOXAPARIN SODIUM 40 MG/0.4ML IJ SOSY
40.0000 mg | PREFILLED_SYRINGE | INTRAMUSCULAR | Status: DC
  Administered 2021-09-29 – 2021-10-01 (×3): 40 mg via SUBCUTANEOUS
  Filled 2021-09-29 (×3): qty 0.4

## 2021-09-29 MED ORDER — SODIUM CHLORIDE 0.45 % IV SOLN: INTRAVENOUS | Status: DC

## 2021-09-29 MED ORDER — DEXTROSE IN LACTATED RINGERS 5 % IV SOLN: INTRAVENOUS | Status: DC

## 2021-09-29 MED ORDER — AMLODIPINE BESYLATE 10 MG PO TABS
10.0000 mg | ORAL_TABLET | Freq: Every day | ORAL | Status: DC
  Administered 2021-09-29 – 2021-10-01 (×3): 10 mg via ORAL
  Filled 2021-09-29 (×2): qty 1
  Filled 2021-09-29: qty 2

## 2021-09-29 MED ORDER — LABETALOL HCL 5 MG/ML IV SOLN
10.0000 mg | Freq: Once | INTRAVENOUS | Status: AC
  Administered 2021-09-29: 10 mg via INTRAVENOUS
  Filled 2021-09-29: qty 4

## 2021-09-29 MED ORDER — LABETALOL HCL 5 MG/ML IV SOLN
10.0000 mg | INTRAVENOUS | Status: DC | PRN
Start: 2021-09-29 — End: 2021-10-02

## 2021-09-29 MED ORDER — LISINOPRIL-HYDROCHLOROTHIAZIDE 20-25 MG PO TABS: 1.0000 | ORAL_TABLET | Freq: Every day | ORAL | Status: DC

## 2021-09-29 MED ORDER — KETOROLAC TROMETHAMINE 30 MG/ML IJ SOLN
30.0000 mg | Freq: Four times a day (QID) | INTRAMUSCULAR | Status: DC | PRN
  Administered 2021-09-30 (×3): 30 mg via INTRAVENOUS
  Filled 2021-09-29 (×3): qty 1

## 2021-09-29 MED ORDER — INSULIN REGULAR(HUMAN) IN NACL 100-0.9 UT/100ML-% IV SOLN
INTRAVENOUS | Status: DC
  Administered 2021-09-29: 19 [IU]/h via INTRAVENOUS
  Filled 2021-09-29: qty 100

## 2021-09-29 MED ORDER — FLUCONAZOLE 100 MG PO TABS
200.0000 mg | ORAL_TABLET | Freq: Once | ORAL | Status: AC
  Administered 2021-09-29: 200 mg via ORAL
  Filled 2021-09-29 (×2): qty 2

## 2021-09-29 MED ORDER — LACTULOSE ENEMA
300.0000 mL | Freq: Two times a day (BID) | ORAL | Status: DC
  Administered 2021-09-29: 300 mL via RECTAL
  Filled 2021-09-29 (×3): qty 300

## 2021-09-29 MED ORDER — LISINOPRIL 20 MG PO TABS
20.0000 mg | ORAL_TABLET | Freq: Every day | ORAL | Status: DC
  Administered 2021-09-29: 20 mg via ORAL
  Filled 2021-09-29: qty 2
  Filled 2021-09-29: qty 1
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
## 2021-09-29 NOTE — Assessment & Plan Note (Addendum)
Patient with h/o HTN usually controlled. On presentation to ED BP 240/150. Patient feels this may be pain driven.  He did receive labetolol IV in ED Plan: --cont home amlodipine --resume home Toprol --resume home Lisinopril-HCTZ

## 2021-09-29 NOTE — ED Notes (Signed)
Insulin drip started  pt up to bathroom  family with pt.  Pt alert.  Skin warm and dry.

## 2021-09-29 NOTE — Progress Notes (Signed)
CRITICAL VALUE STICKER  CRITICAL VALUE: glucose 691  RECEIVER (on-site recipient of call): Toniann Ket RN  DATE & TIME NOTIFIED: 09/29/21 1:33 PM   MESSENGER (representative from lab): lab  MD NOTIFIED: Dr. Erma Heritage  TIME OF NOTIFICATION: 1:33 PM   RESPONSE: see orders

## 2021-09-29 NOTE — Assessment & Plan Note (Addendum)
--  CT evidence for possible recent passage of stone but currently w/o hydronephrosis or ureteral obstruction. He does have stranding about the right kidney suggestive of pyelonephritis.  He does report a 10 day h/o fevers and chills. He has a leukocytosis to 14.2 with left shift. --started on ceftriaxone on admission --cont ceftriaxone pending urine cx

## 2021-09-29 NOTE — Assessment & Plan Note (Addendum)
Patient takes metformin. Reports he is usually well controlled. ON presentation serum glucose 691. He was started on IV insulin protocol in ED and transitioned to glargine 10u nightly --current A1c 14.7, up from 6.5 a year ago. Plan: --increase glargine to 10u BID --add mealtime 10u TID --SSI TID --diabetic coordinator for education
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
## 2021-09-29 NOTE — ED Notes (Signed)
Pt moved to room 13  report off to amber rn.  Pt alert.  Iv meds infusing.
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
## 2021-09-29 NOTE — ED Triage Notes (Signed)
Pt here from [facility] with abd and lower back pain. Pt also has been constipated and has tried several medication. Pt hypertensive, took medication this morning. Urologist states pt has prostatitis.

## 2021-09-29 NOTE — Assessment & Plan Note (Signed)
H/o kidney stone - seen [facility] ED 05/03/21 dx'd with stone. Did not require hospitalization. Per CTA Abd/pelv Jonathon Page recently passed stone but not stone seen, no hydroureter

## 2021-09-29 NOTE — ED Notes (Signed)
Only able to obtain 1 set [facility]

## 2021-09-30 DIAGNOSIS — N10 Acute tubulo-interstitial nephritis: Secondary | ICD-10-CM | POA: Diagnosis not present

## 2021-09-30 DIAGNOSIS — G8929 Other chronic pain: Secondary | ICD-10-CM

## 2021-09-30 DIAGNOSIS — K5900 Constipation, unspecified: Secondary | ICD-10-CM

## 2021-09-30 DIAGNOSIS — E440 Moderate protein-calorie malnutrition: Secondary | ICD-10-CM | POA: Insufficient documentation

## 2021-09-30 DIAGNOSIS — E43 Unspecified severe protein-calorie malnutrition: Secondary | ICD-10-CM | POA: Insufficient documentation

## 2021-09-30 LAB — BASIC METABOLIC PANEL
Anion gap: 10 (ref 5–15)
BUN: 20 mg/dL (ref 6–20)
CO2: 25 mmol/L (ref 22–32)
Calcium: 8.6 mg/dL — ABNORMAL LOW (ref 8.9–10.3)
Chloride: 97 mmol/L — ABNORMAL LOW (ref 98–111)
Creatinine, Ser: 1.56 mg/dL — ABNORMAL HIGH (ref 0.61–1.24)
GFR, Estimated: 56 mL/min — ABNORMAL LOW (ref >60)
Glucose, Bld: 314 mg/dL — ABNORMAL HIGH (ref 70–99)
Potassium: 4.4 mmol/L (ref 3.5–5.1)
Sodium: 132 mmol/L — ABNORMAL LOW (ref 135–145)

## 2021-09-30 LAB — CBG MONITORING, ED
Glucose-Capillary: 167 mg/dL — ABNORMAL HIGH (ref 70–99)
Glucose-Capillary: 183 mg/dL — ABNORMAL HIGH (ref 70–99)
Glucose-Capillary: 223 mg/dL — ABNORMAL HIGH (ref 70–99)
Glucose-Capillary: 341 mg/dL — ABNORMAL HIGH (ref 70–99)

## 2021-09-30 LAB — GLUCOSE, CAPILLARY
Glucose-Capillary: 361 mg/dL — ABNORMAL HIGH (ref 70–99)
Glucose-Capillary: 349 mg/dL — ABNORMAL HIGH (ref 70–99)
Glucose-Capillary: 185 mg/dL — ABNORMAL HIGH (ref 70–99)

## 2021-09-30 LAB — CBC
HCT: 43.8 % (ref 39.0–52.0)
Hemoglobin: 14.4 g/dL (ref 13.0–17.0)
MCH: 29.4 pg (ref 26.0–34.0)
MCHC: 32.9 g/dL (ref 30.0–36.0)
MCV: 89.6 fL (ref 80.0–100.0)
Platelets: 199 10*3/uL (ref 150–400)
RBC: 4.89 MIL/uL (ref 4.22–5.81)
RDW: 12.7 % (ref 11.5–15.5)
WBC: 12 10*3/uL — ABNORMAL HIGH (ref 4.0–10.5)
nRBC: 0 % (ref 0.0–0.2)

## 2021-09-30 LAB — HEMOGLOBIN A1C
Hgb A1c MFr Bld: 14.7 % — ABNORMAL HIGH (ref 4.8–5.6)
Mean Plasma Glucose: 375.19 mg/dL

## 2021-09-30 LAB — BETA-HYDROXYBUTYRIC ACID: Beta-Hydroxybutyric Acid: 1.08 mmol/L — ABNORMAL HIGH (ref 0.05–0.27)

## 2021-09-30 MED ORDER — INSULIN GLARGINE-YFGN 100 UNIT/ML ~~LOC~~ SOLN
10.0000 [IU] | Freq: Two times a day (BID) | SUBCUTANEOUS | Status: DC
  Administered 2021-09-30 (×2): 10 [IU] via SUBCUTANEOUS
  Filled 2021-09-30 (×3): qty 0.1

## 2021-09-30 MED ORDER — POLYETHYLENE GLYCOL 3350 17 G PO PACK
34.0000 g | PACK | ORAL | Status: AC
  Administered 2021-09-30 (×5): 34 g via ORAL
  Filled 2021-09-30 (×4): qty 2

## 2021-09-30 MED ORDER — ENSURE MAX PROTEIN PO LIQD
11.0000 [oz_av] | Freq: Three times a day (TID) | ORAL | Status: DC
  Administered 2021-09-30 – 2021-10-01 (×5): 11 [oz_av] via ORAL

## 2021-09-30 MED ORDER — SODIUM CHLORIDE 0.45 % IV SOLN: INTRAVENOUS | Status: DC

## 2021-09-30 MED ORDER — METOPROLOL SUCCINATE ER 100 MG PO TB24
100.0000 mg | ORAL_TABLET | Freq: Every day | ORAL | Status: DC
  Administered 2021-10-01: 100 mg via ORAL
  Filled 2021-09-30: qty 1

## 2021-09-30 MED ORDER — ADULT MULTIVITAMIN W/MINERALS CH
1.0000 | ORAL_TABLET | Freq: Every day | ORAL | Status: DC
  Administered 2021-10-01: 1 via ORAL
  Filled 2021-09-30: qty 1

## 2021-09-30 MED ORDER — LISINOPRIL-HYDROCHLOROTHIAZIDE 20-25 MG PO TABS
1.0000 | ORAL_TABLET | Freq: Every day | ORAL | Status: DC
Start: 2021-09-30 — End: 2021-09-30

## 2021-09-30 MED ORDER — OXYCODONE-ACETAMINOPHEN 5-325 MG PO TABS
2.0000 | ORAL_TABLET | Freq: Four times a day (QID) | ORAL | Status: DC | PRN
  Administered 2021-09-30 – 2021-10-02 (×6): 2 via ORAL
  Filled 2021-09-30 (×8): qty 2

## 2021-09-30 MED ORDER — LIVING WELL WITH DIABETES BOOK
Freq: Once | Status: AC
  Filled 2021-09-30: qty 1

## 2021-09-30 MED ORDER — LACTATED RINGERS IV SOLN
INTRAVENOUS | Status: DC
  Administered 2021-09-30: 100 mL/h via INTRAVENOUS

## 2021-09-30 MED ORDER — INSULIN ASPART 100 UNIT/ML IJ SOLN
10.0000 [IU] | Freq: Three times a day (TID) | INTRAMUSCULAR | Status: DC
  Administered 2021-10-01 (×2): 10 [IU] via SUBCUTANEOUS
  Filled 2021-09-30 (×3): qty 1

## 2021-09-30 MED ORDER — INSULIN ASPART 100 UNIT/ML IJ SOLN
0.0000 [IU] | Freq: Three times a day (TID) | INTRAMUSCULAR | Status: DC
  Administered 2021-09-30: 15 [IU] via SUBCUTANEOUS
  Administered 2021-09-30 (×2): 11 [IU] via SUBCUTANEOUS
  Administered 2021-10-01: 15 [IU] via SUBCUTANEOUS
  Administered 2021-10-01: 5 [IU] via SUBCUTANEOUS
  Filled 2021-09-30 (×5): qty 1

## 2021-09-30 MED ORDER — CLONAZEPAM 1 MG PO TABS
1.0000 mg | ORAL_TABLET | Freq: Two times a day (BID) | ORAL | Status: DC | PRN
  Administered 2021-10-01: 1 mg via ORAL
  Filled 2021-09-30 (×2): qty 1

## 2021-09-30 MED ORDER — INSULIN STARTER KIT- PEN NEEDLES (ENGLISH)
1.0000 | Freq: Once | Status: AC
  Administered 2021-09-30: 1
  Filled 2021-09-30: qty 1

## 2021-09-30 MED ORDER — NICOTINE 21 MG/24HR TD PT24
21.0000 mg | MEDICATED_PATCH | Freq: Every day | TRANSDERMAL | Status: DC
  Administered 2021-09-30 – 2021-10-01 (×2): 21 mg via TRANSDERMAL
  Filled 2021-09-30 (×2): qty 1

## 2021-09-30 MED ORDER — INSULIN ASPART 100 UNIT/ML IJ SOLN
5.0000 [IU] | Freq: Three times a day (TID) | INTRAMUSCULAR | Status: DC
  Administered 2021-09-30 (×2): 5 [IU] via SUBCUTANEOUS
  Filled 2021-09-30 (×2): qty 1

## 2021-09-30 MED ORDER — HYDROCHLOROTHIAZIDE 25 MG PO TABS
25.0000 mg | ORAL_TABLET | Freq: Every day | ORAL | Status: DC
  Administered 2021-09-30 – 2021-10-01 (×2): 25 mg via ORAL
  Filled 2021-09-30 (×2): qty 1

## 2021-09-30 NOTE — Progress Notes (Signed)
       CROSS COVER NOTE  NAME: Jonathon Page MRN: [id number] DOB : 03-04-1976  Notified by nursing they received ENDOTOOL alert that patient is to be transitioned off of insulin infusion.  Chart reviewed.  Beta-hydroxybutyrate level normalizing.  Anion gap was not elevated on initiation on insulin drip.  Patient not on scheduled basal insulin at home - 10U of Daily Semglee was ordered and given at 2233. Insulin infusion to be stopped now.  Diabetic diet additionally started.    Initiating accuchecks QAC and QHS with sliding scale insulin.  Neomia Glass DNP, MHA, FNP-BC Nurse Practitioner [facility] Pager [phone]

## 2021-09-30 NOTE — Assessment & Plan Note (Signed)
--  high-dose miralax

## 2021-09-30 NOTE — Progress Notes (Signed)
Admission profile updated. ?

## 2021-09-30 NOTE — Plan of Care (Signed)
Nutrition Education Note   RD consulted for nutrition education regarding diabetes.   Lab Results  Component Value Date   HGBA1C 14.7 (H) 09/29/2021    RD provided "Nutrition and Type II Diabetes" handout from the Academy of Nutrition and Dietetics. Discussed different food groups and their effects on blood sugar, emphasizing carbohydrate-containing foods. Provided list of carbohydrates and recommended serving sizes of common foods.  Discussed importance of controlled and consistent carbohydrate intake throughout the day. Provided examples of ways to balance meals/snacks and encouraged intake of high-fiber, whole grain complex carbohydrates. Teach back method used.  Expect poor compliance.  RD following this pt  Betsey Holiday MS, RD, LDN Please refer to [facility] for RD and/or RD on-call/weekend/after hours pager

## 2021-09-30 NOTE — Inpatient Diabetes Management (Addendum)
Inpatient Diabetes Program Recommendations  AACE/ADA: New Consensus Statement on Inpatient Glycemic Control (2015)  Target Ranges:  Prepandial:   less than 140 mg/dL      Peak postprandial:   less than 180 mg/dL (1-2 hours)      Critically ill patients:  140 - 180 mg/dL   Lab Results  Component Value Date   GLUCAP 341 (H) 09/30/2021   HGBA1C 14.7 (H) 09/29/2021    Review of Glycemic Control  Diabetes history: DM2 Outpatient Diabetes medications: Glipizide 4 mg QAM (not taking), Jardiance 25 mg (not started yet), Metfromin 1000 mg BID Current orders for Inpatient glycemic control: Semglee 10 units QD, Novolog 0-15 units TID and 0-5 units QHS  Inpatient Diabetes Program Recommendations:    Semglee 20 units QAM  Will see patient today.  Ordered LWWD, booklet, insulin starter kit and RD consult.  Please use each patient interaction to provide diabetes education. Please review Living Well with Diabetes booklet with the patient, have patient watch patient education videos on diabetes, and instruct on insulin administration. Please allow patient to be actively engaged with diabetes management by allowing patient to check own glucose and self-administer insulin injections. Diabetes Coordinator will follow up with patient and reinforce diabetes education.  Addendum@15 :23: Attempted to speak with patient.  Just received pain medicine and is sleeping.  Will see tomorrow.  RN, Ubaldo Glassing is aware.     Will continue to follow while inpatient.  Thank you, Reche Dixon, MSN, [place] Diabetes Coordinator Inpatient Diabetes Program [phone] (team pager from 8a-5p)

## 2021-09-30 NOTE — Progress Notes (Signed)
Initial Nutrition Assessment  DOCUMENTATION CODES:   Non-severe (moderate) malnutrition in context of chronic illness  INTERVENTION:   Ensure Max protein supplement TID, each supplement provides 150kcal and 30g of protein.  MVI po daily   Pt at high refeed risk; recommend monitor potassium, magnesium and phosphorus labs daily until stable  DM diet education   NUTRITION DIAGNOSIS:   Moderate Malnutrition related to chronic illness (uncontrolled DM) as evidenced by mild fat depletion, moderate fat depletion, mild muscle depletion, moderate muscle depletion.  GOAL:   Patient will meet greater than or equal to 90% of their needs  MONITOR:   PO intake, Supplement acceptance, Labs, Weight trends, Skin, I & O's  REASON FOR ASSESSMENT:   Consult Diet education  ASSESSMENT:   46 y/o with right UE neural damage after [place], DM on oral medications, HTN generally controlled, peripheral neuropathy, back pain and nephrolithiasis who is admitted with acute pyelonephritis.  Met with pt in room today. Pt reports poor appetite and oral intake along with food getting stuck in his throat for the past several weeks. Pt reports that he believes that his swallowing issues are coming from his thrush. Pt reports that he has been drinking some protein supplements at home. Pt reports that he has lost down from his UBW of 225lbs; this would be a 8lbs(4%) weight loss. RD unsure how recently weight loss occurred; pt documented to weigh 220lbs in January. Pt reports that his oral intake is improved in hospital. Pt ate 100% of his lunch today. RD discussed with pt the importance of adequate nutrition needed to preserve lean muscle. RD will add supplements and MVI to help pt meet his estimated needs. Pt is likely at refeed risk. RD provided pt with DM diet education today.   Medications reviewed and include: lovenox, insulin, lactulose, miralax, ceftriaxone   Labs reviewed: Na 132(L), K 4.4 wnl, creat  1.56(H) Wbc- 12.0(H) Cbgs- 361, 341, 223, 167, 183 x 24 hrs AIC 14.7(H)- 6/12  NUTRITION - FOCUSED PHYSICAL EXAM:  Flowsheet Row Most Recent Value  Orbital Region Mild depletion  Upper Arm Region Severe depletion  Thoracic and Lumbar Region Moderate depletion  Buccal Region Mild depletion  Temple Region Mild depletion  Clavicle Bone Region Moderate depletion  Clavicle and Acromion Bone Region Moderate depletion  Scapular Bone Region Mild depletion  Dorsal Hand Moderate depletion  Patellar Region Moderate depletion  Anterior Thigh Region Moderate depletion  Posterior Calf Region Moderate depletion  Edema (RD Assessment) None  Hair Reviewed  Eyes Reviewed  Mouth Reviewed  Skin Reviewed  Nails Reviewed   Diet Order:   Diet Order             Diet Carb Modified Fluid consistency: Thin; Room service appropriate? Yes  Diet effective now                  EDUCATION NEEDS:   Education needs have been addressed  Skin:  Skin Assessment: Reviewed RN Assessment  Last BM:  pta  Height:   Ht Readings from Last 1 Encounters:  09/29/21 6' 4" (1.93 m)    Weight:   Wt Readings from Last 1 Encounters:  09/29/21 98.9 kg    Ideal Body Weight:  91.8 kg  BMI:  Body mass index is 26.54 kg/m.  Estimated Nutritional Needs:   Kcal:  2600-2900kcal/day  Protein:  >130g/day  Fluid:  2.7-3.0L/day  Koleen Distance MS, RD, LDN Please refer to [facility] for RD and/or RD on-call/weekend/after hours pager

## 2021-09-30 NOTE — Assessment & Plan Note (Signed)
--  supplements per dietitian 
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
## 2021-09-30 NOTE — Assessment & Plan Note (Signed)
On chronic opioids --cont home percocet

## 2021-10-01 ENCOUNTER — Other Ambulatory Visit (HOSPITAL_COMMUNITY): Payer: Self-pay

## 2021-10-01 DIAGNOSIS — N10 Acute tubulo-interstitial nephritis: Secondary | ICD-10-CM | POA: Diagnosis not present

## 2021-10-01 LAB — GLUCOSE, CAPILLARY
Glucose-Capillary: 412 mg/dL — ABNORMAL HIGH (ref 70–99)
Glucose-Capillary: 377 mg/dL — ABNORMAL HIGH (ref 70–99)
Glucose-Capillary: 214 mg/dL — ABNORMAL HIGH (ref 70–99)
Glucose-Capillary: 78 mg/dL (ref 70–99)
Glucose-Capillary: 222 mg/dL — ABNORMAL HIGH (ref 70–99)

## 2021-10-01 LAB — CBC
HCT: 40.9 % (ref 39.0–52.0)
Hemoglobin: 13.6 g/dL (ref 13.0–17.0)
MCH: 29.5 pg (ref 26.0–34.0)
MCHC: 33.3 g/dL (ref 30.0–36.0)
MCV: 88.7 fL (ref 80.0–100.0)
Platelets: 161 10*3/uL (ref 150–400)
RBC: 4.61 MIL/uL (ref 4.22–5.81)
RDW: 12.8 % (ref 11.5–15.5)
WBC: 6.9 10*3/uL (ref 4.0–10.5)
nRBC: 0 % (ref 0.0–0.2)

## 2021-10-01 LAB — BASIC METABOLIC PANEL
Anion gap: 7 (ref 5–15)
BUN: 37 mg/dL — ABNORMAL HIGH (ref 6–20)
CO2: 26 mmol/L (ref 22–32)
Calcium: 8.2 mg/dL — ABNORMAL LOW (ref 8.9–10.3)
Chloride: 99 mmol/L (ref 98–111)
Creatinine, Ser: 1.82 mg/dL — ABNORMAL HIGH (ref 0.61–1.24)
GFR, Estimated: 46 mL/min — ABNORMAL LOW (ref >60)
Glucose, Bld: 367 mg/dL — ABNORMAL HIGH (ref 70–99)
Potassium: 4 mmol/L (ref 3.5–5.1)
Sodium: 132 mmol/L — ABNORMAL LOW (ref 135–145)

## 2021-10-01 LAB — MAGNESIUM: Magnesium: 2 mg/dL (ref 1.7–2.4)

## 2021-10-01 LAB — URINE CULTURE: Culture: NO GROWTH

## 2021-10-01 MED ORDER — LACTATED RINGERS IV SOLN
INTRAVENOUS | Status: DC
Start: 2021-10-01 — End: 2021-10-02

## 2021-10-01 MED ORDER — POLYETHYLENE GLYCOL 3350 17 G PO PACK
17.0000 g | PACK | Freq: Two times a day (BID) | ORAL | Status: DC
  Administered 2021-10-01: 17 g via ORAL
  Filled 2021-10-01: qty 1

## 2021-10-01 MED ORDER — INSULIN GLARGINE-YFGN 100 UNIT/ML ~~LOC~~ SOLN
15.0000 [IU] | Freq: Two times a day (BID) | SUBCUTANEOUS | Status: DC
  Administered 2021-10-01 (×2): 15 [IU] via SUBCUTANEOUS
  Filled 2021-10-01 (×3): qty 0.15

## 2021-10-01 MED ORDER — LACTULOSE ENEMA
300.0000 mL | Freq: Two times a day (BID) | RECTAL | Status: DC
Start: 2021-10-01 — End: 2021-10-01
  Filled 2021-10-01: qty 300

## 2021-10-01 NOTE — TOC Benefit Eligibility Note (Signed)
Patient Scientific laboratory technician completed.     The patient is currently admitted and upon discharge could be taking Levemir.   The current 30 day co-pay is, $0.   The patient is currently admitted and upon discharge could be taking Toujeo.   The current 30 day co-pay is, $0.   The patient is currently admitted and upon discharge could be taking Lantus.   The current 30 day co-pay is, $0.   The patient is currently admitted and upon discharge could be taking Hospital doctor.   The current 30 day co-pay is, Requiring prior auth.    The patient is currently admitted and upon discharge could be taking Humalog.   The current 30 day co-pay is, $0.    The patient is currently admitted and upon discharge could be taking Novolog.   The current 30 day co-pay is, Requiring prior auth.    The patient is currently admitted and upon discharge could be taking Lyumjev.   The current 30 day co-pay is, $0.   The patient is insured through [employer].
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
## 2021-10-01 NOTE — Inpatient Diabetes Management (Addendum)
Inpatient Diabetes Program Recommendations  AACE/ADA: New Consensus Statement on Inpatient Glycemic Control (2015)  Target Ranges:  Prepandial:   less than 140 mg/dL      Peak postprandial:   less than 180 mg/dL (1-2 hours)      Critically ill patients:  140 - 180 mg/dL   Lab Results  Component Value Date   GLUCAP 185 (H) 09/30/2021   HGBA1C 14.7 (H) 09/29/2021    Review of Glycemic Control  Latest Reference Range & Units 10/01/21 05:36  Glucose 70 - 99 mg/dL 367 (H)  (H): Data is abnormally high  Diabetes history: DM2 Outpatient Diabetes medications: Glipizide 4 mg QAM (not taking), Jardiance 25 mg (not started yet), Metfromin 1000 mg BID Current orders for Inpatient glycemic control: Semglee 10 units BID, Novolog 0-15 units TID and Novolog 10 TID  Inpatient Diabetes Program Recommendations:    Please consider,  Semglee 15 units BID  Noted meal coverage added today-Novolog 10 units TID  Addendum@1014 : Spoke with patient and significant other at bedside.  Confirms above home medications.  Current with PCP.  Spoke with pt and significant other about Type 2 Diabetes. Discussed A1C results with them (14.7% average BG of 375 mg/dL) and explained what an A1C is, basic pathophysiology of DM Type 2, basic home care, basic diabetes diet nutrition principles, importance of checking CBGs and maintaining good CBG control to prevent long-term and short-term complications. Reviewed signs and symptoms of hyperglycemia and hypoglycemia and how to treat hypoglycemia at home. Also reviewed blood sugar goals at home.  RNs to provide ongoing basic DM education at bedside with this patient. Have ordered educational booklet, insulin starter kit, and DM videos. Have also placed RD consult for DM diet education for this patient.   Educated patient on insulin pen use at home. Reviewed contents of insulin flexpen starter kit. Reviewed all steps of insulin pen including attachment of needle, 2-unit air  shot, dialing up dose, giving injection, removing needle, disposal of sharps, storage of unused insulin, disposal of insulin etc.  He does not have use of his right hand but was able to successfully demonstrate how to use the insulin pen.   Also reviewed troubleshooting with insulin pen. MD to give patient Rxs for insulin pens and insulin pen needles.  He has Medicaid and Medicare.  He is interested in the [employer] at DC.  Will ask MD for order.  His right arm is atrophied and the skin is very sensitive.  Would not recommend placing Libre on right arm.  He may do better with the Dexcom as that can be placed on the arm/abdomen/upper glut.  Told him to speak to his PCP about the Dexcom.    Would not recommend starting Jardiance which was prescribed by his PCP as this class of medication should be avoided with a history of UTIs.    He has a follow up PCP appointment this Friday.  He will show his CGM readings to his PCP.    For DC please order:  Glucometer order# [id number] Freestyle Libre sensor order# [id number] Insulin pen needles order# [id number] Humalog kwikpen order# [id number] ($0 copay) Lantus Solostar insulin pen order# [id number] ($0 copay)  Will continue to follow while inpatient.  Thank you, Reche Dixon, MSN, [place] Diabetes Coordinator Inpatient Diabetes Program [phone] (team pager from 8a-5p)

## 2021-10-01 NOTE — Progress Notes (Signed)
Mobility Specialist - Progress Note    10/01/21 1500  Mobility  Activity Ambulated independently in hallway  Level of Assistance Independent  Assistive Device None  Distance Ambulated (ft) 500 ft  Activity Response Tolerated well  $Mobility charge 1 Mobility    Pt ambulates >517ft indep.  Clarisa Schools Mobility Specialist 10/01/21, 3:11 PM

## 2021-10-02 LAB — CBC
HCT: 44.5 % (ref 39.0–52.0)
Hemoglobin: 14.3 g/dL (ref 13.0–17.0)
MCH: 29.2 pg (ref 26.0–34.0)
MCHC: 32.1 g/dL (ref 30.0–36.0)
MCV: 90.8 fL (ref 80.0–100.0)
Platelets: 165 10*3/uL (ref 150–400)
RBC: 4.9 MIL/uL (ref 4.22–5.81)
RDW: 12.9 % (ref 11.5–15.5)
WBC: 7.7 10*3/uL (ref 4.0–10.5)
nRBC: 0 % (ref 0.0–0.2)

## 2021-10-02 LAB — GLUCOSE, CAPILLARY
Glucose-Capillary: 61 mg/dL — ABNORMAL LOW (ref 70–99)
Glucose-Capillary: 339 mg/dL — ABNORMAL HIGH (ref 70–99)

## 2021-10-02 LAB — BASIC METABOLIC PANEL
Anion gap: 6 (ref 5–15)
BUN: 27 mg/dL — ABNORMAL HIGH (ref 6–20)
CO2: 28 mmol/L (ref 22–32)
Calcium: 9.1 mg/dL (ref 8.9–10.3)
Chloride: 104 mmol/L (ref 98–111)
Creatinine, Ser: 0.98 mg/dL (ref 0.61–1.24)
GFR, Estimated: >60 mL/min (ref >60)
Glucose, Bld: 332 mg/dL — ABNORMAL HIGH (ref 70–99)
Potassium: 4.3 mmol/L (ref 3.5–5.1)
Sodium: 138 mmol/L (ref 135–145)

## 2021-10-02 LAB — MAGNESIUM: Magnesium: 2 mg/dL (ref 1.7–2.4)

## 2021-10-02 MED ORDER — CEFADROXIL 500 MG PO CAPS: 500.0000 mg | ORAL_CAPSULE | Freq: Two times a day (BID) | ORAL | 0 refills | Status: AC

## 2021-10-02 MED ORDER — INSULIN PEN NEEDLE 32G X 4 MM MISC: 1.0000 | Freq: Three times a day (TID) | 0 refills | Status: AC

## 2021-10-02 MED ORDER — HYDROCHLOROTHIAZIDE 25 MG PO TABS: 25.0000 mg | ORAL_TABLET | Freq: Every day | ORAL | 0 refills | Status: DC

## 2021-10-02 MED ORDER — LANTUS SOLOSTAR 100 UNIT/ML ~~LOC~~ SOPN
15.0000 [IU] | PEN_INJECTOR | Freq: Two times a day (BID) | SUBCUTANEOUS | 1 refills | Status: DC
Start: 2021-10-02 — End: 2023-11-04

## 2021-10-02 MED ORDER — FREESTYLE LIBRE 2 SENSOR MISC: 1.0000 | Freq: Three times a day (TID) | 0 refills | Status: AC

## 2021-10-02 MED ORDER — INSULIN LISPRO (1 UNIT DIAL) 100 UNIT/ML (KWIKPEN)
10.0000 [IU] | PEN_INJECTOR | Freq: Three times a day (TID) | SUBCUTANEOUS | 1 refills | Status: DC
Start: 2021-10-02 — End: 2023-11-04

## 2021-10-02 MED ORDER — BLOOD GLUCOSE METER KIT: PACK | 0 refills | Status: AC

## 2021-10-02 NOTE — Plan of Care (Signed)

## 2021-10-02 NOTE — TOC CM/SW Note (Signed)
Patient has orders to discharge home today. Chart reviewed. PCP is Investment banker, corporate. On room air. No wounds. No TOC needs identified. CSW signing off.  Charlynn Court, CSW [phone]

## 2021-10-02 NOTE — Discharge Summary (Signed)
Physician Discharge Summary  NAVEEN CLARDY DTO:[id number] DOB: [birth year] DOA: 09/29/2021  PCP: [facility]  Admit date: 09/29/2021 Discharge date: 10/02/2021  Admitted From: Home Disposition: Home  Recommendations for Outpatient Follow-up:  Follow up with PCP in 1-2 weeks Follow-up with cardiology as directed, ambulatory referral has been placed  Home Health: No Equipment/Devices: None  Discharge Condition: Stable CODE STATUS: Full Diet recommendation: Carb modified  Brief/Interim Summary:  46 y/o with right UE neural damage after [place], DM on oral medications, HTN generally controlled, peripheral neuropathy, back pain on chronic opioids.  He has a h/o nephrolithiasis most recently Jan '23 dx'd at [facility] ED - did not require hospitalization. He reports 6 weeks of gross hematuria for which he saw a urologist who put him on prolonged course of abx for prostatitis. For the past 10-14 days he has had increased right flank pain with radiation to groin and penis, dysuria, fevers and chills. He also reports constipation with last BM > 10 days ago despite taking OTC treatments.   Presentation consistent with acute pyelonephritis.  Patient was started on IV antibiotics with good result.  Transition to p.o. antibiotics at time of discharge for completion of course.  Patient found to have markedly worsening diabetes mellitus with hemoglobin A1c greater than 14.  Diabetes coordinator involved.  Will discharge patient on insulin basal bolus regimen.  Patient will record his sugars at home and discuss with PCP regarding further management.  Patient also had accelerated hypertension difficult to control in house.  Multiple agents on board.  Blood pressure remains suboptimally controlled at time of discharge.  Per patient and spouse's request we will place ambulatory referral to [facility] cardiology for evaluation and further management.  Of note patient's ceiling had a caving in on the  day of discharge.  The exact reason is unknown to me.  I evaluated the patient immediately after this occurred.  He states that his right arm hurts however he has range of motion at baseline.  I did offer him imaging including a CT head and x-ray imaging of his right arm and shoulder.  Patient declined stating that his pain had improved and his range of motion was baseline.   Discharge Diagnoses:  Principal Problem:   Acute pyelonephritis Active Problems:   Accelerated hypertension   History of nephrolithiasis   DM (diabetes mellitus) (HCC)   Malnutrition of moderate degree   Chronic pain   Constipation   * Acute pyelonephritis --CT evidence for possible recent passage of stone but currently w/o hydronephrosis or ureteral obstruction. He does have stranding about the right kidney suggestive of pyelonephritis.  He does report a 10 day h/o fevers and chills. He has a leukocytosis to 14.2 with left shift. Clinically improving over interval Plan: Discharge home.  Transition to oral antibiotics.  Follow-up outpatient PCP.  Accelerated hypertension Patient with h/o HTN usually controlled. On presentation to ED BP 240/150. Patient feels this may be pain driven.  He did receive labetolol IV in ED.  Blood pressures have been labile Plan: Home amlodipine, Toprol, lisinopril, hydrochlorothiazide have been resumed with appropriate dose modifications to achieve better control.  Blood pressure still suboptimally controlled at time of discharge.  Will place ambulatory referral to cardiology for further evaluation management at the request of the patient and wife  DM (diabetes mellitus) ([place]) Patient takes metformin. Reports he is usually well controlled. ON presentation serum glucose 691. He was started on IV insulin protocol in ED and transitioned to  glargine 10u nightly --current A1c 14.7, up from 6.5 a year ago. Plan: Discharge on basal bolus regimen glargine 15 units twice daily, Humalog 10 units 3  times daily with meals.  Currently home metformin and Jardiance on hold.  Patient will discuss with her primary care physician regarding supplementary oral agents at the time of next visit.  Discharge Instructions  Discharge Instructions     Ambulatory referral to Cardiology   Complete by: As directed    Diet Carb Modified   Complete by: As directed    Increase activity slowly   Complete by: As directed       Allergies as of 10/02/2021       Reactions   Ace Inhibitors Anaphylaxis, Swelling   Pt stated he has not taken an ACEi since date of event        Medication List     STOP taking these medications    Jardiance 25 MG Tabs tablet Generic drug: empagliflozin   metFORMIN 1000 MG tablet Commonly known as: GLUCOPHAGE       TAKE these medications    amlodipine-atorvastatin 10-10 MG tablet Commonly known as: CADUET Take 1 tablet by mouth daily.   blood glucose meter kit and supplies Dispense based on patient and insurance preference. Use up to four times daily as directed. (FOR ICD-10 E10.9, E11.9).   cefadroxil 500 MG capsule Commonly known as: DURICEF Take 1 capsule (500 mg total) by mouth 2 (two) times daily for 7 days.   clonazePAM 1 MG tablet Commonly known as: KLONOPIN Take 1 tablet (1 mg total) by mouth 2 (two) times daily.   famotidine 20 MG tablet Commonly known as: PEPCID Take 20 mg by mouth every 12 (twelve) hours as needed for heartburn or indigestion.   FreeStyle Libre 2 Sensor Misc 1 each by Does not apply route 4 (four) times daily -  before meals and at bedtime.   gabapentin 300 MG capsule Commonly known as: NEURONTIN Take 300 mg by mouth 3 (three) times daily.   hydrochlorothiazide 25 MG tablet Commonly known as: HYDRODIURIL Take 1 tablet (25 mg total) by mouth daily.   insulin lispro 100 UNIT/ML KwikPen Commonly known as: HumaLOG KwikPen Inject 10 Units into the skin 3 (three) times daily.   Insulin Pen Needle 32G X 4 MM Misc 1  each by Does not apply route 4 (four) times daily -  before meals and at bedtime.   Lantus SoloStar 100 UNIT/ML Solostar Pen Generic drug: insulin glargine Inject 15 Units into the skin 2 (two) times daily.   meloxicam 15 MG tablet Commonly known as: MOBIC Take 15 mg by mouth daily.   metoprolol succinate 100 MG 24 hr tablet Commonly known as: TOPROL-XL Take 100 mg by mouth daily.   omeprazole 40 MG capsule Commonly known as: PRILOSEC Take 40 mg by mouth daily.   oxyCODONE-acetaminophen 10-325 MG tablet Commonly known as: PERCOCET Take 1 tablet by mouth 4 (four) times daily as needed.   promethazine 25 MG tablet Commonly known as: PHENERGAN Take 25 mg by mouth every 4 (four) hours as needed for nausea or vomiting.   sildenafil 20 MG tablet Commonly known as: REVATIO Take 20 mg by mouth daily as needed (30 minutes prior to sexual activity).        Allergies  Allergen Reactions   Ace Inhibitors Anaphylaxis and Swelling    Pt stated he has not taken an ACEi since date of event    Consultations: Diabetes coordinator  Procedures/Studies: CT Angio Chest/Abd/Pel for Dissection W and/or Wo Contrast  Result Date: 09/29/2021 CLINICAL DATA:  Abdominal and lower back pain hypertension EXAM: CT ANGIOGRAPHY CHEST, ABDOMEN AND PELVIS TECHNIQUE: Non-contrast CT of the chest was initially obtained. Multidetector CT imaging through the chest, abdomen and pelvis was performed using the standard protocol during bolus administration of intravenous contrast. Multiplanar reconstructed images and MIPs were obtained and reviewed to evaluate the vascular anatomy. RADIATION DOSE REDUCTION: This exam was performed according to the departmental dose-optimization program which includes automated exposure control, adjustment of the mA and/or kV according to patient size and/or use of iterative reconstruction technique. CONTRAST:  149m OMNIPAQUE IOHEXOL 350 MG/ML SOLN COMPARISON:  CT abdomen/pelvis  04/23/2019 FINDINGS: CTA CHEST FINDINGS Cardiovascular: There is no evidence of acute intramural hematoma on the initial noncontrast study. There is no evidence of thoracic aortic aneurysm or dissection. There is no evidence of mediastinal hematoma. 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 The main pulmonary artery is mildly enlarged at 3.2 cm. The heart is not enlarged. There is no pericardial effusion. Mediastinum/Nodes: Imaged thyroid is unremarkable. Esophagus is grossly unremarkable. There is no mediastinal, axillary, or hilar lymphadenopathy. Lungs/Pleura: The trachea and central airways are patent The lungs are clear, with no focal consolidation or pulmonary edema. There is no pleural effusion or pneumothorax. There are no suspicious nodules. Musculoskeletal: Deformity of the right first and second ribs is consistent with prior trauma. There is no acute osseous abnormality or suspicious osseous lesion. Other: There is a small area of focal skin thickening in the left axilla. Review of the MIP images confirms the above findings. CTA ABDOMEN AND PELVIS FINDINGS VASCULAR Aorta: Normal caliber aorta without aneurysm, dissection, vasculitis or significant stenosis. There is minimal calcified plaque inferiorly. Celiac: Patent without evidence of aneurysm, dissection, vasculitis or significant stenosis. SMA: Patent without evidence of aneurysm, dissection, vasculitis or significant stenosis. Renals: Both renal arteries are patent without evidence of aneurysm, dissection, vasculitis, fibromuscular dysplasia or significant stenosis. An accessory left renal artery is noted arising from the aorta at the L3 level. IMA: Patent without evidence of aneurysm, dissection, vasculitis or significant stenosis. Inflow: Patent without evidence of aneurysm, dissection, vasculitis or significant stenosis.  Veins: No obvious venous abnormality within the limitations of this arterial phase study. Review of the MIP images confirms the above findings. NON-VASCULAR Hepatobiliary: The liver and gallbladder are unremarkable. There is no biliary ductal dilatation. Pancreas: Unremarkable. Spleen: Unremarkable. Adrenals/Urinary Tract: The adrenals are unremarkable. There is slightly decreased enhancement of the right kidney compared to the left. There is stranding in the perinephric fat as well as periureteral stranding along the course of the ureter. There is no hydronephrosis or hydroureter. No stone is identified. The left kidney and ureter are normal. There is no hydronephrosis or hydroureter on the left. The bladder is unremarkable. Stomach/Bowel: The stomach is unremarkable. There is no evidence of bowel obstruction. There is no abnormal bowel wall thickening or inflammatory change. There are scattered colonic diverticula without evidence of acute diverticulitis. Appendix is normal. Lymphatic: There is no abdominopelvic lymphadenopathy. Reproductive: The prostate and seminal vesicles are unremarkable. Other: There is no ascites or free air. Musculoskeletal: There is no acute osseous abnormality or suspicious osseous lesion. Review of the MIP images confirms the above findings. IMPRESSION: 1. No evidence of aortic dissection or aneurysm. 2. Stranding in the fat around the right  kidney and ureter with slightly decreased enhancement of the right kidney compared to the left raise suspicion for ascending urinary tract infection, or possibly a recently passed stone. Correlate with lab values and symptoms. No stone or hydroureteronephrosis is seen. 3. No other evidence of acute pathology in the chest, abdomen, or pelvis. 4. 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 5. Small area of focal skin  thickening in the left axilla. Correlate with physical exam. Electronically Signed   By: Valetta Mole M.D.   On: 09/29/2021 13:54      Subjective: Seen and examined on day of discharge.  Stable no distress.  Stable for discharge home  Discharge Exam: Vitals:   10/02/21 0341 10/02/21 0821  BP: (!) 153/89 (!) 192/117  Pulse: 79 97  Resp: 16 20  Temp: 97.7 F (36.5 C) 98.1 F (36.7 C)  SpO2: 94% 100%   Vitals:   10/01/21 1545 10/01/21 1936 10/02/21 0341 10/02/21 0821  BP: 118/74 (!) 156/97 (!) 153/89 (!) 192/117  Pulse: 84 92 79 97  Resp: _0 Temp: 97.9 F (36.6 C) 98.2 F (36.8 C) 97.7 F (36.5 C) 98.1 F (36.7 C)  TempSrc:  Oral Oral Oral  SpO2: 98% 100% 94% 100%  Weight:      Height:        General: Pt is alert, awake, not in acute distress Cardiovascular: RRR, S1/S2 +, no rubs, no gallops Respiratory: CTA bilaterally, no wheezing, no rhonchi Abdominal: Soft, NT, ND, bowel sounds + Extremities: Right upper extremity decreased range of motion, muscle wasting noted    The results of significant diagnostics from this hospitalization (including imaging, microbiology, ancillary and laboratory) are listed below for reference.     Microbiology: Recent Results (from the past 240 hour(s))  Blood culture (routine x 2)     Status: None (Preliminary result)   Collection Time: 09/29/21  5:32 PM   Specimen: BLOOD  Result Value Ref Range Status   Specimen Description BLOOD BLOOD LEFT HAND  Final   Special Requests   Final    BOTTLES DRAWN AEROBIC AND ANAEROBIC Blood Culture adequate volume   Culture   Final    NO GROWTH 3 DAYS Performed at [facility], [address], [place], [place] [address]    Report Status PENDING  Incomplete  Urine Culture     Status: None   Collection Time: 09/29/21  9:59 PM   Specimen: Urine, Random  Result Value Ref Range Status   Specimen Description   Final    URINE, RANDOM Performed at [facility], [address], [place], [place] [address]    Special Requests   Final    NONE Performed at [facility], [address], [place], [place] [address]    Culture   Final    NO GROWTH Performed at [facility], [place] [address], [place], [place] [address]    Report Status 10/01/2021 FINAL  Final  Culture, blood (Routine X 2) w Reflex to ID Panel     Status: None (Preliminary result)   Collection Time: 09/30/21 10:55 AM   Specimen: BLOOD  Result Value Ref Range Status   Specimen Description BLOOD LEFT ANTECUBITAL  Final   Special Requests   Final    BOTTLES DRAWN AEROBIC AND ANAEROBIC Blood Culture adequate volume   Culture  Final    NO GROWTH 2 DAYS Performed at [facility], [address] [place] [address] [place], [place] [address]    Report Status PENDING  Incomplete     Labs: BNP (last 3 results) No results for input(s): "BNP" in the last 8760 hours. Basic Metabolic Panel: Recent Labs  Lab 09/29/21 1732 09/29/21 2157 09/30/21 0433 10/01/21 0536 10/02/21 0521  NA 135 137 132* 132* 138  K 3.5 4.0 4.4 4.0 4.3  CL 102 100 97* 99 104  CO2 _0 GLUCOSE 236* 174* 314* 367* 332*  BUN _1 37* 27*  CREATININE 0.96 1.13 1.56* 1.82* 0.98  CALCIUM 8.9 8.8* 8.6* 8.2* 9.1  MG  --   --   --  2.0 2.0   Liver Function Tests: Recent Labs  Lab 09/29/21 1245  AST 40  ALT 57*  ALKPHOS 193*  BILITOT 1.4*  PROT 8.4*  ALBUMIN 4.4   Recent Labs  Lab 09/29/21 1245  LIPASE 46   No results for input(s): "AMMONIA" in the last 168 hours. CBC: Recent Labs  Lab 09/29/21 1245 09/29/21 1409 09/30/21 0433 10/01/21 0536 10/02/21 0521  WBC 15.1* 14.2* 12.0* 6.9 7.7  NEUTROABS  --  12.3*  --   --   --   HGB 17.8* 17.4* 14.4 13.6 14.3  HCT 51.9 49.9 43.8 40.9 44.5  MCV 86.4 86.0 89.6 88.7 90.8  PLT 179 163 199 161 165   Cardiac Enzymes: No results for input(s): "CKTOTAL", "CKMB", "CKMBINDEX", "TROPONINI" in the last 168 hours. BNP: Invalid input(s):  "POCBNP" CBG: Recent Labs  Lab 10/01/21 1155 10/01/21 1627 10/01/21 1712 10/01/21 2035 10/02/21 0817  GLUCAP 214* 61* 78 222* 339*   D-Dimer No results for input(s): "DDIMER" in the last 72 hours. Hgb A1c Recent Labs    09/29/21 1409  HGBA1C 14.7*   Lipid Profile No results for input(s): "CHOL", "HDL", "LDLCALC", "TRIG", "CHOLHDL", "LDLDIRECT" in the last 72 hours. Thyroid function studies No results for input(s): "TSH", "T4TOTAL", "T3FREE", "THYROIDAB" in the last 72 hours.  Invalid input(s): "FREET3" Anemia work up No results for input(s): "VITAMINB12", "FOLATE", "FERRITIN", "TIBC", "IRON", "RETICCTPCT" in the last 72 hours. Urinalysis    Component Value Date/Time   COLORURINE YELLOW (A) 09/29/2021 2159   APPEARANCEUR HAZY (A) 09/29/2021 2159   LABSPEC >1.046 (H) 09/29/2021 2159   PHURINE 5.0 09/29/2021 2159   GLUCOSEU >=500 (A) 09/29/2021 2159   HGBUR LARGE (A) 09/29/2021 2159   BILIRUBINUR NEGATIVE 09/29/2021 2159   KETONESUR 5 (A) 09/29/2021 2159   PROTEINUR 100 (A) 09/29/2021 2159   NITRITE NEGATIVE 09/29/2021 2159   LEUKOCYTESUR NEGATIVE 09/29/2021 2159   Sepsis Labs Recent Labs  Lab 09/29/21 1409 09/30/21 0433 10/01/21 0536 10/02/21 0521  WBC 14.2* 12.0* 6.9 7.7   Microbiology Recent Results (from the past 240 hour(s))  Blood culture (routine x 2)     Status: None (Preliminary result)   Collection Time: 09/29/21  5:32 PM   Specimen: BLOOD  Result Value Ref Range Status   Specimen Description BLOOD BLOOD LEFT HAND  Final   Special Requests   Final    BOTTLES DRAWN AEROBIC AND ANAEROBIC Blood Culture adequate volume   Culture   Final    NO GROWTH 3 DAYS Performed at [facility], [address] [place], [place] [address]    Report Status PENDING  Incomplete  Urine Culture     Status: None   Collection Time: 09/29/21  9:59 PM  Specimen: Urine, Random  Result Value Ref Range Status   Specimen Description   Final    URINE,  RANDOM Performed at [facility], [address], [place], [place] [address]    Special Requests   Final    NONE Performed at [facility], [address] [place], [place] [address]    Culture   Final    NO GROWTH Performed at [facility], [place] [address] [place], [place] [address]    Report Status 10/01/2021 FINAL  Final  Culture, blood (Routine X 2) w Reflex to ID Panel     Status: None (Preliminary result)   Collection Time: 09/30/21 10:55 AM   Specimen: BLOOD  Result Value Ref Range Status   Specimen Description BLOOD LEFT ANTECUBITAL  Final   Special Requests   Final    BOTTLES DRAWN AEROBIC AND ANAEROBIC Blood Culture adequate volume   Culture   Final    NO GROWTH 2 DAYS Performed at [facility], [address] [place], [place] [address]    Report Status PENDING  Incomplete     Time coordinating discharge: Over 30 minutes  SIGNED:   Sidney Ace, MD  [facility] 10/02/2021, 1:15 PM Pager   If 7PM-7AM, please contact night-coverage

## 2021-10-02 NOTE — Inpatient Diabetes Management (Signed)
Inpatient Diabetes Program Recommendations  AACE/ADA: New Consensus Statement on Inpatient Glycemic Control (2015)  Target Ranges:  Prepandial:   less than 140 mg/dL      Peak postprandial:   less than 180 mg/dL (1-2 hours)      Critically ill patients:  140 - 180 mg/dL    Latest Reference Range & Units 10/01/21 08:19 10/01/21 10:00 10/01/21 11:55 10/01/21 17:12 10/01/21 20:35  Glucose-Capillary 70 - 99 mg/dL 412 (H)  25 units Novolog  377 (H)    15 units Semglee @1006  214 (H)  15 units Novolog  78 222 (H)    15 units Semglee @2058   (H): Data is abnormally high    Admit with: Acute pyelonephritis  History: DM2  Home DM Meds: Amaryl 4 mg QAM (not taking), Jardiance 25 mg (not started yet), Metformin 1000 mg BID  Current Orders: Semglee 15 units BID      Novolog Moderate Correction Scale/ SSI (0-15 units) TID AC       Novolog 10 units TID with meals     Met w/ pt outside of pt's room in hallway.  Reviewed Freestyle Libre 2 CGM system (how to use, how to place new sensor, sensor life, troubleshooting, warm-up time, how to use app on phone, rotation of insertion sites, etc).  Assisted pt to download the Freestyle app to their Phone.  Assisted pt to assemble and place Libre sensor on L Upper arm.  Pt knows they will able to scan for CBG 1 hr after sensor placed.  Pt instructed to replace sensor in 14 days.  Encouraged pt to scan for CBGs pre and post meals.  Reviewed healthy CBG goals for home.  Pt asked to seek refills for the sensors from their PCP.  Pt educated to check fingerstick CBG with traditional CBG meter when glucose reading does not match how they feels.  RN made aware that CGM applied.  Also reviewed discharge insulin regimen: Lantus 15 units BID + Humalog 10 units TID with meals.  Reviewed signs and symptoms of Hypoglycemia and how to treat at home.  Pt and wife stated they did not have any questions regarding the use of insulin pens at home.  Reminded pt and wife  to watch You tube video on Insulin pen use at home and [facility] application in case they forget a step.  Pt and wife appreciative of all info.    --Will follow patient during hospitalization--  Wyn Quaker RN, MSN, CDE Diabetes Coordinator Inpatient Glycemic Control Team Team Pager: [phone] (8a-5p)

## 2021-10-04 LAB — CULTURE, BLOOD (ROUTINE X 2)
Culture: NO GROWTH
Special Requests: ADEQUATE

## 2021-10-05 LAB — CULTURE, BLOOD (ROUTINE X 2)
Culture: NO GROWTH
Special Requests: ADEQUATE
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
## 2022-09-07 ENCOUNTER — Emergency Department (HOSPITAL_COMMUNITY)
Admission: EM | Admit: 2022-09-07 | Discharge: 2022-09-07 | Disposition: A | Discharge status: Home / Self Care | Payer: 59 | Attending: Emergency Medicine | Admitting: Emergency Medicine

## 2022-09-07 ENCOUNTER — Emergency Department (HOSPITAL_COMMUNITY): Payer: 59

## 2022-09-07 ENCOUNTER — Other Ambulatory Visit: Payer: Self-pay

## 2022-09-07 DIAGNOSIS — T40601A Poisoning by unspecified narcotics, accidental (unintentional), initial encounter: Secondary | ICD-10-CM

## 2022-09-07 DIAGNOSIS — Z79899 Other long term (current) drug therapy: Secondary | ICD-10-CM | POA: Diagnosis not present

## 2022-09-07 DIAGNOSIS — E119 Type 2 diabetes mellitus without complications: Secondary | ICD-10-CM | POA: Insufficient documentation

## 2022-09-07 DIAGNOSIS — I10 Essential (primary) hypertension: Secondary | ICD-10-CM | POA: Diagnosis not present

## 2022-09-07 LAB — CBC WITH DIFFERENTIAL/PLATELET
Abs Immature Granulocytes: 0.03 10*3/uL (ref 0.00–0.07)
Basophils Absolute: 0.1 10*3/uL (ref 0.0–0.1)
Basophils Relative: 1 %
Eosinophils Absolute: 0 10*3/uL (ref 0.0–0.5)
Eosinophils Relative: 0 %
HCT: 45.1 % (ref 39.0–52.0)
Hemoglobin: 14.5 g/dL (ref 13.0–17.0)
Immature Granulocytes: 0 %
Lymphocytes Relative: 8 %
Lymphs Abs: 0.9 10*3/uL (ref 0.7–4.0)
MCH: 29.5 pg (ref 26.0–34.0)
MCHC: 32.2 g/dL (ref 30.0–36.0)
MCV: 91.9 fL (ref 80.0–100.0)
Monocytes Absolute: 0.5 10*3/uL (ref 0.1–1.0)
Monocytes Relative: 5 %
Neutro Abs: 9.2 10*3/uL — ABNORMAL HIGH (ref 1.7–7.7)
Neutrophils Relative %: 86 %
Platelets: 198 10*3/uL (ref 150–400)
RBC: 4.91 MIL/uL (ref 4.22–5.81)
RDW: 13.9 % (ref 11.5–15.5)
WBC: 10.7 10*3/uL — ABNORMAL HIGH (ref 4.0–10.5)
nRBC: 0 % (ref 0.0–0.2)

## 2022-09-07 LAB — COMPREHENSIVE METABOLIC PANEL
ALT: 19 U/L (ref 0–44)
AST: 19 U/L (ref 15–41)
Albumin: 4.2 g/dL (ref 3.5–5.0)
Alkaline Phosphatase: 68 U/L (ref 38–126)
Anion gap: 8 (ref 5–15)
BUN: 17 mg/dL (ref 6–20)
CO2: 28 mmol/L (ref 22–32)
Calcium: 8.9 mg/dL (ref 8.9–10.3)
Chloride: 104 mmol/L (ref 98–111)
Creatinine, Ser: 1.19 mg/dL (ref 0.61–1.24)
GFR, Estimated: >60 mL/min (ref >60)
Glucose, Bld: 171 mg/dL — ABNORMAL HIGH (ref 70–99)
Potassium: 4.1 mmol/L (ref 3.5–5.1)
Sodium: 140 mmol/L (ref 135–145)
Total Bilirubin: 0.6 mg/dL (ref 0.3–1.2)
Total Protein: 7.5 g/dL (ref 6.5–8.1)

## 2022-09-07 LAB — ACETAMINOPHEN LEVEL: Acetaminophen (Tylenol), Serum: <10 ug/mL — ABNORMAL LOW (ref 10–30)

## 2022-09-07 MED ORDER — SODIUM CHLORIDE 0.9 % IV BOLUS
1000.0000 mL | Freq: Once | INTRAVENOUS | Status: AC
  Administered 2022-09-07: 1000 mL via INTRAVENOUS

## 2022-09-07 NOTE — ED Provider Notes (Signed)
EMERGENCY DEPARTMENT AT [facility] Provider Note   CSN: [id number] Arrival date & time: 09/07/22  1616     History {Add pertinent medical, surgical, social history, OB history to HPI:1} Chief Complaint  Patient presents with   Drug Overdose    Jonathon Page is a 47 y.o. male.  Patient has a history of diabetes and hypertension.  Patient was found unresponsive but responded to Narcan   Drug Overdose       Home Medications Prior to Admission medications   Medication Sig Start Date End Date Taking? Authorizing Provider  amlodipine-atorvastatin (CADUET) 10-10 MG tablet Take 1 tablet by mouth daily. 04/10/21   [provider]  blood glucose meter kit and supplies Dispense based on patient and insurance preference. Use up to four times daily as directed. (FOR ICD-10 E10.9, E11.9). 10/02/21   Tresa Moore, MD  clonazePAM (KLONOPIN) 1 MG tablet Take 1 tablet (1 mg total) by mouth 2 (two) times daily. 01/07/20   Mannam, Colbert Coyer, MD  Continuous Blood Gluc Sensor (FREESTYLE LIBRE 2 SENSOR) MISC 1 each by Does not apply route 4 (four) times daily -  before meals and at bedtime. 10/02/21   Tresa Moore, MD  famotidine (PEPCID) 20 MG tablet Take 20 mg by mouth every 12 (twelve) hours as needed for heartburn or indigestion.    [provider]  gabapentin (NEURONTIN) 300 MG capsule Take 300 mg by mouth 3 (three) times daily. 09/14/21   [provider]  hydrochlorothiazide (HYDRODIURIL) 25 MG tablet Take 1 tablet (25 mg total) by mouth daily. 10/02/21 11/01/21  Tresa Moore, MD  insulin glargine (LANTUS SOLOSTAR) 100 UNIT/ML Solostar Pen Inject 15 Units into the skin 2 (two) times daily. 10/02/21 12/01/21  Tresa Moore, MD  insulin lispro (HUMALOG KWIKPEN) 100 UNIT/ML KwikPen Inject 10 Units into the skin 3 (three) times daily. 10/02/21 12/01/21  Tresa Moore, MD  Insulin Pen Needle 32G X 4 MM MISC 1 each by Does not  apply route 4 (four) times daily -  before meals and at bedtime. 10/02/21   Tresa Moore, MD  meloxicam (MOBIC) 15 MG tablet Take 15 mg by mouth daily. 08/05/21   [provider]  metoprolol succinate (TOPROL-XL) 100 MG 24 hr tablet Take 100 mg by mouth daily. 06/07/21   [provider]  omeprazole (PRILOSEC) 40 MG capsule Take 40 mg by mouth daily. 05/04/21   [provider]  oxyCODONE-acetaminophen (PERCOCET) 10-325 MG tablet Take 1 tablet by mouth 4 (four) times daily as needed. 09/03/21   [provider]  promethazine (PHENERGAN) 25 MG tablet Take 25 mg by mouth every 4 (four) hours as needed for nausea or vomiting.  04/04/19   [provider]  sildenafil (REVATIO) 20 MG tablet Take 20 mg by mouth daily as needed (30 minutes prior to sexual activity).    [provider]      Allergies    Ace inhibitors    Review of Systems   Review of Systems  Physical Exam Updated Vital Signs BP (!) 187/122   Pulse 87   Temp 97.7 F (36.5 C) (Oral)   Resp 16   SpO2 91%  Physical Exam  ED Results / Procedures / Treatments   Labs (all labs ordered are listed, but only abnormal results are displayed) Labs Reviewed  CBC WITH DIFFERENTIAL/PLATELET - Abnormal; Notable for the following components:      Result Value   WBC  10.7 (*)    Neutro Abs 9.2 (*)    All other components within normal limits  COMPREHENSIVE METABOLIC PANEL - Abnormal; Notable for the following components:   Glucose, Bld 171 (*)    All other components within normal limits  ACETAMINOPHEN LEVEL - Abnormal; Notable for the following components:   Acetaminophen (Tylenol), Serum <10 (*)    All other components within normal limits  RAPID URINE DRUG SCREEN, HOSP PERFORMED    EKG EKG Interpretation  Date/Time:  Monday Sep 07 2022 17:42:33 EDT Ventricular Rate:  94 PR Interval:  160 QRS Duration: 96 QT Interval:  366 QTC Calculation: 457 R Axis:   63 Text  Interpretation: Normal sinus rhythm Right atrial enlargement Minimal voltage criteria for LVH, may be normal variant ( Sokolow-Lyon ) Nonspecific T wave abnormality Abnormal ECG When compared with ECG of 29-Sep-2021 12:42, PREVIOUS ECG IS PRESENT Confirmed by Bethann Berkshire [id number]) on 09/07/2022 7:13:58 PM  Radiology DG Chest 2 View  Result Date: 09/07/2022 CLINICAL DATA:  Shortness of breath EXAM: CHEST - 2 VIEW COMPARISON:  01/06/2020, 09/29/2021 FINDINGS: Cardiac silhouette is mildly enlarged. Subtle slightly nodular density projecting over the periphery of the right mid lung which may represent a small airspace opacity or possibly a nipple shadow. The lungs appear otherwise clear. No pleural effusion or pneumothorax. Prior right humeral ORIF. IMPRESSION: 1. Subtle slightly nodular density projecting over the periphery of the right mid lung which may represent a small airspace opacity or possibly a nipple shadow. Repeat chest radiograph with nipple markers can be obtained to further assess. 2. Mild cardiomegaly. Electronically Signed   By: Duanne Guess D.O.   On: 09/07/2022 18:47    Procedures Procedures  {Document cardiac monitor, telemetry assessment procedure when appropriate:1}  Medications Ordered in ED Medications  sodium chloride 0.9 % bolus 1,000 mL (1,000 mLs Intravenous New Bag/Given 09/07/22 1818)    ED Course/ Medical Decision Making/ A&P   {   Click here for ABCD2, HEART and other calculatorsREFRESH Note before signing :1}                          Medical Decision Making Amount and/or Complexity of Data Reviewed Labs: ordered. Radiology: ordered. ECG/medicine tests: ordered.   Patient was observed in the emergency department and is awake and alert and wants to go home.  He will take his blood pressure medicine when he gets home  {Document critical care time when appropriate:1} {Document review of labs and clinical decision tools ie heart score, Chads2Vasc2 etc:1}   {Document your independent review of radiology images, and any outside records:1} {Document your discussion with family members, caretakers, and with consultants:1} {Document social determinants of health affecting pt's care:1} {Document your decision making why or why not admission, treatments were needed:1} Final Clinical Impression(s) / ED Diagnoses Final diagnoses:  Opiate overdose, accidental or unintentional, initial encounter [facility])    Rx / DC Orders ED Discharge Orders     None

## 2022-09-07 NOTE — ED Triage Notes (Signed)
EMS reports OD of unknown from home. On arrival found apneic and laying on kitchen floor unresponsive. Given total of 3mg  Narcan with response (1mg  IV) Pt arrives A&O on arrival.   BP 270/150 HR 108 RR 16 Sp02 99 RA CBG 186  18ga L forearm.

## 2022-09-07 NOTE — Discharge Instructions (Addendum)
Take your blood pressure medicine when you get home.  Follow-up with your doctor this week for recheck

## 2022-09-07 NOTE — ED Notes (Signed)
Urinal at bedside.  

## 2022-09-07 NOTE — ED Notes (Signed)
Pt awake and ambulated to bathroom. Pt given blue scrub top.

## 2023-05-16 NOTE — ED Provider Notes (Addendum)
 8:59 PM I received notification that patient's troponin was elevated and immediately went to the waiting room to look for the patient.  Unfortunately he had left the emergency department.  I called his mobile number and left a voicemail and I also tried to reach his spouse.  I also tried to reach his other emergency contacts but the numbers were out of order.   Cristopher Leita Caldron, MD 05/16/23 2100   ED Course as of 05/17/23 0024  [place] May 16, 2023  2048 Rhinovirus/Enterovirus(!): Detected  2048 Troponin I(!!): 0.058  2048 Glucose(!): 181  2048 WBC(!): 13.2  2048 Patient's troponin came back positive while he was in the waiting room.  Have asked nursing to find him a room immediately.      Cristopher Leita Caldron, MD 05/17/23 [phone]

## 2023-05-16 NOTE — ED Triage Notes (Signed)
 Cough, runny nose and difficulty breathing x 1 week.  Presents tonight with increased shortness of breath, upper abdomen pain and left arm pain that started worsening this AM.

## 2023-05-17 ENCOUNTER — Emergency Department
Admission: EM | Admit: 2023-05-17 | Discharge: 2023-05-18 | Discharge status: Against Medical Advice | Payer: 59 | Attending: Emergency Medicine | Admitting: Emergency Medicine

## 2023-05-17 ENCOUNTER — Other Ambulatory Visit: Payer: Self-pay

## 2023-05-17 ENCOUNTER — Emergency Department: Payer: 59

## 2023-05-17 DIAGNOSIS — R079 Chest pain, unspecified: Secondary | ICD-10-CM | POA: Diagnosis present

## 2023-05-17 DIAGNOSIS — I10 Essential (primary) hypertension: Secondary | ICD-10-CM | POA: Diagnosis not present

## 2023-05-17 DIAGNOSIS — Z5321 Procedure and treatment not carried out due to patient leaving prior to being seen by health care provider: Secondary | ICD-10-CM | POA: Diagnosis not present

## 2023-05-17 LAB — CBC
HCT: 43.6 % (ref 39.0–52.0)
Hemoglobin: 14.1 g/dL (ref 13.0–17.0)
MCH: 29.3 pg (ref 26.0–34.0)
MCHC: 32.3 g/dL (ref 30.0–36.0)
MCV: 90.5 fL (ref 80.0–100.0)
Platelets: 171 10*3/uL (ref 150–400)
RBC: 4.82 MIL/uL (ref 4.22–5.81)
RDW: 13.8 % (ref 11.5–15.5)
WBC: 8.5 10*3/uL (ref 4.0–10.5)
nRBC: 0 % (ref 0.0–0.2)

## 2023-05-17 LAB — BASIC METABOLIC PANEL
Anion gap: 13 (ref 5–15)
BUN: 23 mg/dL — ABNORMAL HIGH (ref 6–20)
CO2: 24 mmol/L (ref 22–32)
Calcium: 8.7 mg/dL — ABNORMAL LOW (ref 8.9–10.3)
Chloride: 102 mmol/L (ref 98–111)
Creatinine, Ser: 1.16 mg/dL (ref 0.61–1.24)
GFR, Estimated: >60 mL/min (ref >60)
Glucose, Bld: 146 mg/dL — ABNORMAL HIGH (ref 70–99)
Potassium: 3.5 mmol/L (ref 3.5–5.1)
Sodium: 139 mmol/L (ref 135–145)

## 2023-05-17 LAB — TROPONIN I (HIGH SENSITIVITY): Troponin I (High Sensitivity): 61 ng/L — ABNORMAL HIGH (ref <18)

## 2023-05-17 NOTE — ED Notes (Signed)
No answer when called several times from lobby

## 2023-05-17 NOTE — ED Triage Notes (Addendum)
Pt to ED via POV c/o central CP X1 week. Pt reports he was seen at a [facility] hospital, troponin levels were high and was sent over here for further work up. Pt endorses SHOB with CP. Tested positive for rhinovirus yesterday as well. Pt has hx of HTN, has not been taking his HTN meds for a few days.

## 2023-05-18 ENCOUNTER — Telehealth: Payer: Self-pay | Admitting: Emergency Medicine

## 2023-05-18 NOTE — ED Notes (Signed)
No answer when called several times from lobby

## 2023-05-18 NOTE — Telephone Encounter (Signed)
Called patient due to left emergency department before provider exam to inquire about condition and follow up plans. Left message.

## 2023-10-30 ENCOUNTER — Emergency Department

## 2023-10-30 ENCOUNTER — Observation Stay

## 2023-10-30 ENCOUNTER — Other Ambulatory Visit: Payer: Self-pay

## 2023-10-30 ENCOUNTER — Inpatient Hospital Stay
Admission: EM | Admit: 2023-10-30 | Discharge: 2023-11-04 | DRG: 291 | Disposition: A | Discharge status: Home / Self Care | Attending: Student | Admitting: Student

## 2023-10-30 DIAGNOSIS — J810 Acute pulmonary edema: Secondary | ICD-10-CM

## 2023-10-30 DIAGNOSIS — R911 Solitary pulmonary nodule: Secondary | ICD-10-CM | POA: Diagnosis present

## 2023-10-30 DIAGNOSIS — F1410 Cocaine abuse, uncomplicated: Secondary | ICD-10-CM | POA: Diagnosis present

## 2023-10-30 DIAGNOSIS — F17210 Nicotine dependence, cigarettes, uncomplicated: Secondary | ICD-10-CM | POA: Diagnosis present

## 2023-10-30 DIAGNOSIS — R1310 Dysphagia, unspecified: Secondary | ICD-10-CM

## 2023-10-30 DIAGNOSIS — I5033 Acute on chronic diastolic (congestive) heart failure: Secondary | ICD-10-CM | POA: Insufficient documentation

## 2023-10-30 DIAGNOSIS — K920 Hematemesis: Secondary | ICD-10-CM | POA: Diagnosis present

## 2023-10-30 DIAGNOSIS — E1165 Type 2 diabetes mellitus with hyperglycemia: Secondary | ICD-10-CM | POA: Diagnosis present

## 2023-10-30 DIAGNOSIS — R3129 Other microscopic hematuria: Secondary | ICD-10-CM | POA: Diagnosis present

## 2023-10-30 DIAGNOSIS — G8191 Hemiplegia, unspecified affecting right dominant side: Secondary | ICD-10-CM | POA: Diagnosis present

## 2023-10-30 DIAGNOSIS — Z79899 Other long term (current) drug therapy: Secondary | ICD-10-CM

## 2023-10-30 DIAGNOSIS — G43909 Migraine, unspecified, not intractable, without status migrainosus: Secondary | ICD-10-CM | POA: Diagnosis present

## 2023-10-30 DIAGNOSIS — E785 Hyperlipidemia, unspecified: Secondary | ICD-10-CM | POA: Diagnosis present

## 2023-10-30 DIAGNOSIS — I161 Hypertensive emergency: Principal | ICD-10-CM | POA: Diagnosis present

## 2023-10-30 DIAGNOSIS — Z791 Long term (current) use of non-steroidal anti-inflammatories (NSAID): Secondary | ICD-10-CM

## 2023-10-30 DIAGNOSIS — I509 Heart failure, unspecified: Secondary | ICD-10-CM

## 2023-10-30 DIAGNOSIS — Z888 Allergy status to other drugs, medicaments and biological substances status: Secondary | ICD-10-CM

## 2023-10-30 DIAGNOSIS — I110 Hypertensive heart disease with heart failure: Secondary | ICD-10-CM | POA: Diagnosis not present

## 2023-10-30 DIAGNOSIS — Z79891 Long term (current) use of opiate analgesic: Secondary | ICD-10-CM

## 2023-10-30 DIAGNOSIS — Z8249 Family history of ischemic heart disease and other diseases of the circulatory system: Secondary | ICD-10-CM

## 2023-10-30 DIAGNOSIS — D72829 Elevated white blood cell count, unspecified: Secondary | ICD-10-CM | POA: Diagnosis present

## 2023-10-30 DIAGNOSIS — F1510 Other stimulant abuse, uncomplicated: Secondary | ICD-10-CM | POA: Diagnosis present

## 2023-10-30 DIAGNOSIS — E876 Hypokalemia: Secondary | ICD-10-CM | POA: Diagnosis present

## 2023-10-30 DIAGNOSIS — R000 Tachycardia, unspecified: Secondary | ICD-10-CM | POA: Diagnosis present

## 2023-10-30 DIAGNOSIS — I5043 Acute on chronic combined systolic (congestive) and diastolic (congestive) heart failure: Secondary | ICD-10-CM | POA: Diagnosis present

## 2023-10-30 DIAGNOSIS — R079 Chest pain, unspecified: Secondary | ICD-10-CM

## 2023-10-30 DIAGNOSIS — K222 Esophageal obstruction: Secondary | ICD-10-CM | POA: Diagnosis present

## 2023-10-30 DIAGNOSIS — Z6823 Body mass index (BMI) 23.0-23.9, adult: Secondary | ICD-10-CM

## 2023-10-30 DIAGNOSIS — K5909 Other constipation: Secondary | ICD-10-CM | POA: Diagnosis present

## 2023-10-30 DIAGNOSIS — E43 Unspecified severe protein-calorie malnutrition: Secondary | ICD-10-CM | POA: Diagnosis present

## 2023-10-30 DIAGNOSIS — Z808 Family history of malignant neoplasm of other organs or systems: Secondary | ICD-10-CM

## 2023-10-30 DIAGNOSIS — N179 Acute kidney failure, unspecified: Secondary | ICD-10-CM | POA: Diagnosis present

## 2023-10-30 DIAGNOSIS — Z820 Family history of epilepsy and other diseases of the nervous system: Secondary | ICD-10-CM

## 2023-10-30 DIAGNOSIS — I2489 Other forms of acute ischemic heart disease: Secondary | ICD-10-CM | POA: Diagnosis present

## 2023-10-30 DIAGNOSIS — E871 Hypo-osmolality and hyponatremia: Secondary | ICD-10-CM | POA: Diagnosis present

## 2023-10-30 DIAGNOSIS — I10 Essential (primary) hypertension: Secondary | ICD-10-CM | POA: Diagnosis present

## 2023-10-30 DIAGNOSIS — T40601D Poisoning by unspecified narcotics, accidental (unintentional), subsequent encounter: Secondary | ICD-10-CM

## 2023-10-30 DIAGNOSIS — Z794 Long term (current) use of insulin: Secondary | ICD-10-CM

## 2023-10-30 LAB — HEPATIC FUNCTION PANEL
ALT: 23 U/L (ref 0–44)
AST: 26 U/L (ref 15–41)
Albumin: 4.1 g/dL (ref 3.5–5.0)
Alkaline Phosphatase: 104 U/L (ref 38–126)
Bilirubin, Direct: 0.2 mg/dL (ref 0.0–0.2)
Indirect Bilirubin: 0.9 mg/dL (ref 0.3–0.9)
Total Bilirubin: 1.1 mg/dL (ref 0.0–1.2)
Total Protein: 7.4 g/dL (ref 6.5–8.1)

## 2023-10-30 LAB — BASIC METABOLIC PANEL WITH GFR
Anion gap: 11 (ref 5–15)
BUN: 30 mg/dL — ABNORMAL HIGH (ref 6–20)
CO2: 28 mmol/L (ref 22–32)
Calcium: 9.2 mg/dL (ref 8.9–10.3)
Chloride: 95 mmol/L — ABNORMAL LOW (ref 98–111)
Creatinine, Ser: 1.5 mg/dL — ABNORMAL HIGH (ref 0.61–1.24)
GFR, Estimated: 58 mL/min — ABNORMAL LOW (ref >60)
Glucose, Bld: 259 mg/dL — ABNORMAL HIGH (ref 70–99)
Potassium: 4.6 mmol/L (ref 3.5–5.1)
Sodium: 134 mmol/L — ABNORMAL LOW (ref 135–145)

## 2023-10-30 LAB — CBC
HCT: 40.6 % (ref 39.0–52.0)
Hemoglobin: 13.5 g/dL (ref 13.0–17.0)
MCH: 29.7 pg (ref 26.0–34.0)
MCHC: 33.3 g/dL (ref 30.0–36.0)
MCV: 89.4 fL (ref 80.0–100.0)
Platelets: 219 K/uL (ref 150–400)
RBC: 4.54 MIL/uL (ref 4.22–5.81)
RDW: 15.1 % (ref 11.5–15.5)
WBC: 15.5 K/uL — ABNORMAL HIGH (ref 4.0–10.5)
nRBC: 0 % (ref 0.0–0.2)

## 2023-10-30 LAB — URINE DRUG SCREEN, QUALITATIVE (ARMC ONLY)
Amphetamines, Ur Screen: POSITIVE — AB
Barbiturates, Ur Screen: NOT DETECTED
Benzodiazepine, Ur Scrn: NOT DETECTED
Cannabinoid 50 Ng, Ur ~~LOC~~: NOT DETECTED
Cocaine Metabolite,Ur ~~LOC~~: NOT DETECTED
MDMA (Ecstasy)Ur Screen: NOT DETECTED
Methadone Scn, Ur: NOT DETECTED
Opiate, Ur Screen: NOT DETECTED
Phencyclidine (PCP) Ur S: NOT DETECTED
Tricyclic, Ur Screen: NOT DETECTED

## 2023-10-30 LAB — BRAIN NATRIURETIC PEPTIDE: B Natriuretic Peptide: 2220.6 pg/mL — ABNORMAL HIGH (ref 0.0–100.0)

## 2023-10-30 LAB — URINALYSIS, ROUTINE W REFLEX MICROSCOPIC
Bacteria, UA: NONE SEEN
Bilirubin Urine: NEGATIVE
Glucose, UA: >=500 mg/dL — AB
Ketones, ur: NEGATIVE mg/dL
Leukocytes,Ua: NEGATIVE
Nitrite: NEGATIVE
Protein, ur: 30 mg/dL — AB
Specific Gravity, Urine: 1.017 (ref 1.005–1.030)
Squamous Epithelial / HPF: 0 /HPF (ref 0–5)
pH: 6 (ref 5.0–8.0)

## 2023-10-30 LAB — LIPASE, BLOOD: Lipase: 26 U/L (ref 11–51)

## 2023-10-30 LAB — PROCALCITONIN: Procalcitonin: <0.1 ng/mL

## 2023-10-30 LAB — CBG MONITORING, ED
Glucose-Capillary: 227 mg/dL — ABNORMAL HIGH (ref 70–99)
Glucose-Capillary: 173 mg/dL — ABNORMAL HIGH (ref 70–99)
Glucose-Capillary: 95 mg/dL (ref 70–99)
Glucose-Capillary: 265 mg/dL — ABNORMAL HIGH (ref 70–99)

## 2023-10-30 LAB — RESP PANEL BY RT-PCR (RSV, FLU A&B, COVID)  RVPGX2
Influenza A by PCR: NEGATIVE
Influenza B by PCR: NEGATIVE
Resp Syncytial Virus by PCR: NEGATIVE
SARS Coronavirus 2 by RT PCR: NEGATIVE

## 2023-10-30 LAB — PROTEIN / CREATININE RATIO, URINE
Creatinine, Urine: 29 mg/dL
Protein Creatinine Ratio: 1.03 mg/mg{creat} — ABNORMAL HIGH (ref 0.00–0.15)
Total Protein, Urine: 30 mg/dL

## 2023-10-30 LAB — TROPONIN I (HIGH SENSITIVITY)
Troponin I (High Sensitivity): 57 ng/L — ABNORMAL HIGH (ref <18)
Troponin I (High Sensitivity): 53 ng/L — ABNORMAL HIGH (ref <18)

## 2023-10-30 LAB — GLUCOSE, CAPILLARY: Glucose-Capillary: 222 mg/dL — ABNORMAL HIGH (ref 70–99)

## 2023-10-30 LAB — HIV ANTIBODY (ROUTINE TESTING W REFLEX): HIV Screen 4th Generation wRfx: NONREACTIVE

## 2023-10-30 LAB — TSH: TSH: 0.842 u[IU]/mL (ref 0.350–4.500)

## 2023-10-30 MED ORDER — HYDROMORPHONE HCL 1 MG/ML IJ SOLN
1.0000 mg | Freq: Once | INTRAMUSCULAR | Status: AC
  Administered 2023-10-30: 1 mg via INTRAVENOUS
  Filled 2023-10-30: qty 1

## 2023-10-30 MED ORDER — INSULIN GLARGINE-YFGN 100 UNIT/ML ~~LOC~~ SOLN: 20.0000 [IU] | Freq: Every day | SUBCUTANEOUS | Status: DC

## 2023-10-30 MED ORDER — SODIUM CHLORIDE 0.9% FLUSH
3.0000 mL | Freq: Two times a day (BID) | INTRAVENOUS | Status: DC
  Administered 2023-10-30 – 2023-11-04 (×10): 3 mL via INTRAVENOUS

## 2023-10-30 MED ORDER — OXYCODONE HCL 5 MG PO TABS
5.0000 mg | ORAL_TABLET | ORAL | Status: DC | PRN
  Administered 2023-10-30 – 2023-11-04 (×20): 5 mg via ORAL
  Filled 2023-10-30 (×20): qty 1

## 2023-10-30 MED ORDER — PANTOPRAZOLE SODIUM 40 MG PO TBEC
40.0000 mg | DELAYED_RELEASE_TABLET | Freq: Every day | ORAL | Status: DC
  Administered 2023-10-30 – 2023-11-04 (×6): 40 mg via ORAL
  Filled 2023-10-30 (×6): qty 1

## 2023-10-30 MED ORDER — LORAZEPAM 0.5 MG PO TABS: 0.5000 mg | ORAL_TABLET | Freq: Two times a day (BID) | ORAL | Status: DC

## 2023-10-30 MED ORDER — SODIUM CHLORIDE 0.9 % IV SOLN: 250.0000 mL | INTRAVENOUS | Status: AC | PRN

## 2023-10-30 MED ORDER — LABETALOL HCL 5 MG/ML IV SOLN
20.0000 mg | INTRAVENOUS | Status: DC | PRN
  Administered 2023-10-31 – 2023-11-03 (×6): 20 mg via INTRAVENOUS
  Administered 2023-11-03: 10 mg via INTRAVENOUS
  Filled 2023-10-30 (×6): qty 4

## 2023-10-30 MED ORDER — ONDANSETRON HCL 4 MG/2ML IJ SOLN: 4.0000 mg | Freq: Four times a day (QID) | INTRAMUSCULAR | Status: DC | PRN

## 2023-10-30 MED ORDER — GABAPENTIN 300 MG PO CAPS: 300.0000 mg | ORAL_CAPSULE | Freq: Three times a day (TID) | ORAL | Status: DC

## 2023-10-30 MED ORDER — IOHEXOL 350 MG/ML SOLN
100.0000 mL | Freq: Once | INTRAVENOUS | Status: AC | PRN
  Administered 2023-10-30: 100 mL via INTRAVENOUS

## 2023-10-30 MED ORDER — HYDRALAZINE HCL 50 MG PO TABS
25.0000 mg | ORAL_TABLET | Freq: Three times a day (TID) | ORAL | Status: DC
  Administered 2023-10-30: 25 mg via ORAL
  Filled 2023-10-30: qty 1

## 2023-10-30 MED ORDER — SODIUM CHLORIDE 0.9% FLUSH: 3.0000 mL | INTRAVENOUS | Status: DC | PRN

## 2023-10-30 MED ORDER — CLONIDINE HCL 0.1 MG/24HR TD PTWK
0.1000 mg | MEDICATED_PATCH | TRANSDERMAL | Status: DC
  Administered 2023-10-30: 0.1 mg via TRANSDERMAL
  Filled 2023-10-30: qty 1

## 2023-10-30 MED ORDER — INSULIN ASPART 100 UNIT/ML IJ SOLN
0.0000 [IU] | Freq: Every day | INTRAMUSCULAR | Status: DC
  Administered 2023-10-30 – 2023-11-03 (×5): 2 [IU] via SUBCUTANEOUS
  Filled 2023-10-30 (×5): qty 1

## 2023-10-30 MED ORDER — ONDANSETRON HCL 4 MG/2ML IJ SOLN
4.0000 mg | Freq: Once | INTRAMUSCULAR | Status: AC
  Administered 2023-10-30: 4 mg via INTRAVENOUS
  Filled 2023-10-30: qty 2

## 2023-10-30 MED ORDER — CARVEDILOL 6.25 MG PO TABS
3.1250 mg | ORAL_TABLET | Freq: Two times a day (BID) | ORAL | Status: DC
  Administered 2023-10-30: 3.125 mg via ORAL
  Filled 2023-10-30: qty 1

## 2023-10-30 MED ORDER — OXYCODONE-ACETAMINOPHEN 10-325 MG PO TABS: 1.0000 | ORAL_TABLET | Freq: Four times a day (QID) | ORAL | Status: DC | PRN

## 2023-10-30 MED ORDER — HYDRALAZINE HCL 20 MG/ML IJ SOLN
10.0000 mg | Freq: Once | INTRAMUSCULAR | Status: AC
  Administered 2023-10-30: 10 mg via INTRAVENOUS

## 2023-10-30 MED ORDER — FAMOTIDINE 20 MG PO TABS
20.0000 mg | ORAL_TABLET | Freq: Two times a day (BID) | ORAL | Status: DC | PRN
  Filled 2023-10-30: qty 1

## 2023-10-30 MED ORDER — INSULIN ASPART 100 UNIT/ML IJ SOLN
0.0000 [IU] | Freq: Three times a day (TID) | INTRAMUSCULAR | Status: DC
  Administered 2023-10-30: 5 [IU] via SUBCUTANEOUS
  Administered 2023-10-30 – 2023-10-31 (×3): 3 [IU] via SUBCUTANEOUS
  Administered 2023-10-31: 5 [IU] via SUBCUTANEOUS
  Administered 2023-11-01: 8 [IU] via SUBCUTANEOUS
  Administered 2023-11-01: 2 [IU] via SUBCUTANEOUS
  Administered 2023-11-02: 8 [IU] via SUBCUTANEOUS
  Administered 2023-11-02: 3 [IU] via SUBCUTANEOUS
  Administered 2023-11-03: 2 [IU] via SUBCUTANEOUS
  Administered 2023-11-03: 3 [IU] via SUBCUTANEOUS
  Administered 2023-11-03: 5 [IU] via SUBCUTANEOUS
  Administered 2023-11-04 (×2): 3 [IU] via SUBCUTANEOUS
  Filled 2023-10-30 (×14): qty 1

## 2023-10-30 MED ORDER — ENOXAPARIN SODIUM 40 MG/0.4ML IJ SOSY
40.0000 mg | PREFILLED_SYRINGE | INTRAMUSCULAR | Status: DC
  Administered 2023-10-30 – 2023-11-02 (×2): 40 mg via SUBCUTANEOUS
  Filled 2023-10-30 (×4): qty 0.4

## 2023-10-30 MED ORDER — HYDRALAZINE HCL 50 MG PO TABS
50.0000 mg | ORAL_TABLET | Freq: Three times a day (TID) | ORAL | Status: DC
  Administered 2023-10-30 – 2023-11-01 (×6): 50 mg via ORAL
  Filled 2023-10-30 (×6): qty 1

## 2023-10-30 MED ORDER — HYDRALAZINE HCL 20 MG/ML IJ SOLN
5.0000 mg | Freq: Once | INTRAMUSCULAR | Status: DC
  Filled 2023-10-30: qty 1

## 2023-10-30 MED ORDER — ISOSORBIDE MONONITRATE ER 30 MG PO TB24
30.0000 mg | ORAL_TABLET | Freq: Every day | ORAL | Status: DC
  Administered 2023-10-30 – 2023-11-03 (×5): 30 mg via ORAL
  Filled 2023-10-30 (×5): qty 1

## 2023-10-30 MED ORDER — NICARDIPINE HCL IN NACL 20-0.86 MG/200ML-% IV SOLN
3.0000 mg/h | INTRAVENOUS | Status: DC
  Administered 2023-10-30: 5 mg/h via INTRAVENOUS
  Filled 2023-10-30: qty 200

## 2023-10-30 MED ORDER — HYDRALAZINE HCL 20 MG/ML IJ SOLN
10.0000 mg | Freq: Once | INTRAMUSCULAR | Status: AC
  Administered 2023-10-30: 10 mg via INTRAVENOUS
  Filled 2023-10-30: qty 1

## 2023-10-30 MED ORDER — PROMETHAZINE HCL 25 MG PO TABS: 25.0000 mg | ORAL_TABLET | ORAL | Status: DC | PRN

## 2023-10-30 MED ORDER — FUROSEMIDE 10 MG/ML IJ SOLN
40.0000 mg | Freq: Two times a day (BID) | INTRAMUSCULAR | Status: DC
  Administered 2023-10-30 – 2023-11-02 (×6): 40 mg via INTRAVENOUS
  Filled 2023-10-30 (×6): qty 4

## 2023-10-30 MED ORDER — FUROSEMIDE 10 MG/ML IJ SOLN
40.0000 mg | Freq: Once | INTRAMUSCULAR | Status: AC
  Administered 2023-10-30: 40 mg via INTRAVENOUS
  Filled 2023-10-30: qty 4

## 2023-10-30 MED ORDER — ACETAMINOPHEN 325 MG PO TABS
650.0000 mg | ORAL_TABLET | ORAL | Status: DC | PRN
  Administered 2023-10-31: 650 mg via ORAL
  Filled 2023-10-30: qty 2

## 2023-10-30 MED ORDER — CLONAZEPAM 0.5 MG PO TABS: 1.0000 mg | ORAL_TABLET | Freq: Two times a day (BID) | ORAL | Status: DC

## 2023-10-30 MED ORDER — CLONAZEPAM 0.25 MG PO TBDP
0.2500 mg | ORAL_TABLET | Freq: Three times a day (TID) | ORAL | Status: DC
  Administered 2023-10-30 – 2023-11-04 (×15): 0.25 mg via ORAL
  Filled 2023-10-30 (×16): qty 1

## 2023-10-30 MED ORDER — SODIUM CHLORIDE 0.9 % IV BOLUS (SEPSIS)
1000.0000 mL | Freq: Once | INTRAVENOUS | Status: AC
  Administered 2023-10-30: 1000 mL via INTRAVENOUS

## 2023-10-30 MED ORDER — OXYCODONE-ACETAMINOPHEN 5-325 MG PO TABS
1.0000 | ORAL_TABLET | ORAL | Status: DC | PRN
  Administered 2023-10-31 – 2023-11-04 (×16): 1 via ORAL
  Filled 2023-10-30 (×16): qty 1

## 2023-10-30 MED ORDER — AMOXICILLIN-POT CLAVULANATE 875-125 MG PO TABS
1.0000 | ORAL_TABLET | Freq: Two times a day (BID) | ORAL | Status: DC
  Administered 2023-10-30 – 2023-11-02 (×6): 1 via ORAL
  Filled 2023-10-30 (×6): qty 1

## 2023-10-30 MED ORDER — HYDRALAZINE HCL 20 MG/ML IJ SOLN
5.0000 mg | Freq: Four times a day (QID) | INTRAMUSCULAR | Status: DC | PRN
  Administered 2023-10-30 – 2023-11-02 (×2): 5 mg via INTRAVENOUS
  Filled 2023-10-30 (×2): qty 1

## 2023-10-30 MED ORDER — CARVEDILOL 6.25 MG PO TABS
6.2500 mg | ORAL_TABLET | Freq: Two times a day (BID) | ORAL | Status: DC
  Administered 2023-10-30 – 2023-11-01 (×4): 6.25 mg via ORAL
  Filled 2023-10-30 (×4): qty 1

## 2023-10-30 NOTE — ED Notes (Signed)
 Gave pt something to drink at this time per pt request.

## 2023-10-30 NOTE — ED Notes (Signed)
 Notified Mansy MD of pt BP increase post PRN IV hydralazine .
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
## 2023-10-30 NOTE — ED Notes (Signed)
 Remote found and given to patient

## 2023-10-30 NOTE — ED Notes (Signed)
 CCMD called.

## 2023-10-30 NOTE — ED Notes (Signed)
 ED Provider at bedside.

## 2023-10-30 NOTE — ED Notes (Signed)
 Pt states that the dentist told him he has an abscess but he is not on any antibiotic at this time;also has swallowing issues d/t missing/broken teeth and says that things get stuck in his throat for up to a week at times.  Mansy MD notified.

## 2023-10-30 NOTE — ED Notes (Signed)
Pharm tech at bedside 

## 2023-10-30 NOTE — ED Notes (Signed)
 RN sent message to pharm requesting Klonopin 

## 2023-10-30 NOTE — ED Triage Notes (Addendum)
 Patient C/O mid-sternal chest pain that radiates down both arms that began a few hours ago. Patient says he has had this happen before and has stayed in the hospital for a few days for things but cannot give any specifics and says his girlfriend could tell me more. He does tell me that his blood pressure is always high, usually in the 190's systolic, and states that he did take his blood pressure medicine today and is compliant with his meds. Patient endorses a little bit of SOB. Patient also endorses history of panic attacks. Patient took 4 baby aspirin  before arrival.
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
## 2023-10-30 NOTE — ED Notes (Signed)
 Called pharm to request med/verification for Klonopin ; pharmacy reported unable to verify currently as ordered dose is different. Reported will verify after pharm tech speaks with patient.

## 2023-10-30 NOTE — ED Notes (Signed)
 Called pharmacy to request meds be verified. Was informed should be completed shortly.

## 2023-10-31 ENCOUNTER — Observation Stay
Admit: 2023-10-31 | Discharge: 2023-10-31 | Disposition: A | Discharge status: Home / Self Care | Attending: Internal Medicine | Admitting: Internal Medicine

## 2023-10-31 ENCOUNTER — Observation Stay

## 2023-10-31 DIAGNOSIS — E43 Unspecified severe protein-calorie malnutrition: Secondary | ICD-10-CM | POA: Diagnosis present

## 2023-10-31 DIAGNOSIS — R911 Solitary pulmonary nodule: Secondary | ICD-10-CM | POA: Insufficient documentation

## 2023-10-31 DIAGNOSIS — D72829 Elevated white blood cell count, unspecified: Secondary | ICD-10-CM | POA: Diagnosis present

## 2023-10-31 DIAGNOSIS — E1165 Type 2 diabetes mellitus with hyperglycemia: Secondary | ICD-10-CM | POA: Diagnosis present

## 2023-10-31 DIAGNOSIS — R1319 Other dysphagia: Secondary | ICD-10-CM | POA: Diagnosis not present

## 2023-10-31 DIAGNOSIS — R1310 Dysphagia, unspecified: Secondary | ICD-10-CM

## 2023-10-31 DIAGNOSIS — K5909 Other constipation: Secondary | ICD-10-CM | POA: Diagnosis present

## 2023-10-31 DIAGNOSIS — K920 Hematemesis: Secondary | ICD-10-CM | POA: Diagnosis present

## 2023-10-31 DIAGNOSIS — T40601D Poisoning by unspecified narcotics, accidental (unintentional), subsequent encounter: Secondary | ICD-10-CM | POA: Diagnosis not present

## 2023-10-31 DIAGNOSIS — I509 Heart failure, unspecified: Secondary | ICD-10-CM

## 2023-10-31 DIAGNOSIS — Z888 Allergy status to other drugs, medicaments and biological substances status: Secondary | ICD-10-CM | POA: Diagnosis not present

## 2023-10-31 DIAGNOSIS — I5033 Acute on chronic diastolic (congestive) heart failure: Secondary | ICD-10-CM

## 2023-10-31 DIAGNOSIS — F17210 Nicotine dependence, cigarettes, uncomplicated: Secondary | ICD-10-CM | POA: Diagnosis present

## 2023-10-31 DIAGNOSIS — E871 Hypo-osmolality and hyponatremia: Secondary | ICD-10-CM | POA: Diagnosis present

## 2023-10-31 DIAGNOSIS — R3129 Other microscopic hematuria: Secondary | ICD-10-CM | POA: Diagnosis present

## 2023-10-31 DIAGNOSIS — K2289 Other specified disease of esophagus: Secondary | ICD-10-CM | POA: Diagnosis not present

## 2023-10-31 DIAGNOSIS — I2489 Other forms of acute ischemic heart disease: Secondary | ICD-10-CM | POA: Diagnosis present

## 2023-10-31 DIAGNOSIS — I5043 Acute on chronic combined systolic (congestive) and diastolic (congestive) heart failure: Secondary | ICD-10-CM | POA: Diagnosis present

## 2023-10-31 DIAGNOSIS — N179 Acute kidney failure, unspecified: Secondary | ICD-10-CM | POA: Diagnosis present

## 2023-10-31 DIAGNOSIS — E876 Hypokalemia: Secondary | ICD-10-CM | POA: Diagnosis present

## 2023-10-31 DIAGNOSIS — K222 Esophageal obstruction: Secondary | ICD-10-CM | POA: Diagnosis present

## 2023-10-31 DIAGNOSIS — Z794 Long term (current) use of insulin: Secondary | ICD-10-CM | POA: Diagnosis not present

## 2023-10-31 DIAGNOSIS — I161 Hypertensive emergency: Secondary | ICD-10-CM | POA: Diagnosis present

## 2023-10-31 DIAGNOSIS — F1510 Other stimulant abuse, uncomplicated: Secondary | ICD-10-CM | POA: Diagnosis present

## 2023-10-31 DIAGNOSIS — G8191 Hemiplegia, unspecified affecting right dominant side: Secondary | ICD-10-CM | POA: Diagnosis present

## 2023-10-31 DIAGNOSIS — F1410 Cocaine abuse, uncomplicated: Secondary | ICD-10-CM | POA: Diagnosis present

## 2023-10-31 DIAGNOSIS — Z6823 Body mass index (BMI) 23.0-23.9, adult: Secondary | ICD-10-CM | POA: Diagnosis not present

## 2023-10-31 DIAGNOSIS — Z8249 Family history of ischemic heart disease and other diseases of the circulatory system: Secondary | ICD-10-CM | POA: Diagnosis not present

## 2023-10-31 DIAGNOSIS — I110 Hypertensive heart disease with heart failure: Secondary | ICD-10-CM | POA: Diagnosis present

## 2023-10-31 DIAGNOSIS — E785 Hyperlipidemia, unspecified: Secondary | ICD-10-CM | POA: Diagnosis present

## 2023-10-31 LAB — ECHOCARDIOGRAM COMPLETE
AR max vel: 3.77 cm2
AV Area VTI: 4.67 cm2
AV Area mean vel: 3.92 cm2
AV Mean grad: 3 mmHg
AV Peak grad: 5.2 mmHg
Ao pk vel: 1.14 m/s
Area-P 1/2: 4.04 cm2
Calc EF: 45.7 %
Height: 73 in
MV VTI: 3.62 cm2
S' Lateral: 3.7 cm
Single Plane A2C EF: 47.6 %
Single Plane A4C EF: 45.2 %
Weight: 2895.96 [oz_av]

## 2023-10-31 LAB — BASIC METABOLIC PANEL WITH GFR
Anion gap: 13 (ref 5–15)
BUN: 30 mg/dL — ABNORMAL HIGH (ref 6–20)
CO2: 25 mmol/L (ref 22–32)
Calcium: 8.9 mg/dL (ref 8.9–10.3)
Chloride: 93 mmol/L — ABNORMAL LOW (ref 98–111)
Creatinine, Ser: 1.6 mg/dL — ABNORMAL HIGH (ref 0.61–1.24)
GFR, Estimated: 53 mL/min — ABNORMAL LOW (ref >60)
Glucose, Bld: 205 mg/dL — ABNORMAL HIGH (ref 70–99)
Potassium: 3.4 mmol/L — ABNORMAL LOW (ref 3.5–5.1)
Sodium: 131 mmol/L — ABNORMAL LOW (ref 135–145)

## 2023-10-31 LAB — GLUCOSE, CAPILLARY
Glucose-Capillary: 206 mg/dL — ABNORMAL HIGH (ref 70–99)
Glucose-Capillary: 184 mg/dL — ABNORMAL HIGH (ref 70–99)
Glucose-Capillary: 157 mg/dL — ABNORMAL HIGH (ref 70–99)
Glucose-Capillary: 209 mg/dL — ABNORMAL HIGH (ref 70–99)

## 2023-10-31 MED ORDER — LACTULOSE 10 GM/15ML PO SOLN
20.0000 g | Freq: Once | ORAL | Status: AC
  Administered 2023-10-31: 20 g via ORAL
  Filled 2023-10-31: qty 30

## 2023-10-31 MED ORDER — POTASSIUM CHLORIDE CRYS ER 20 MEQ PO TBCR
40.0000 meq | EXTENDED_RELEASE_TABLET | ORAL | Status: AC
  Administered 2023-10-31 (×2): 40 meq via ORAL
  Filled 2023-10-31 (×2): qty 2

## 2023-10-31 MED ORDER — GLUCERNA SHAKE PO LIQD
237.0000 mL | Freq: Two times a day (BID) | ORAL | Status: DC
  Administered 2023-10-31 – 2023-11-02 (×4): 237 mL via ORAL

## 2023-10-31 MED ORDER — SENNOSIDES-DOCUSATE SODIUM 8.6-50 MG PO TABS
2.0000 | ORAL_TABLET | Freq: Two times a day (BID) | ORAL | Status: DC
  Administered 2023-10-31 – 2023-11-03 (×7): 2 via ORAL
  Filled 2023-10-31 (×7): qty 2

## 2023-10-31 NOTE — TOC Progression Note (Signed)
 Transition of Care [facility]) - Progression Note    Patient Details  Name: Jonathon Page MRN: [id number] Date of Birth: [birth year]  [facility] CM/SW Contact  [place] L Nikeria Kalman, [place] Phone Number: 10/31/2023, 9:47 AM  Clinical Narrative:     [facility] consult received however, the consult is needed for the heart failure clinic to complete a screen.        Expected Discharge Plan and Services                                               Social Determinants of Health (SDOH) Interventions SDOH Screenings   Food Insecurity: Patient Declined (10/31/2023)  Housing: Patient Declined (10/31/2023)  Transportation Needs: Patient Declined (10/31/2023)  Utilities: Patient Declined (10/31/2023)  Tobacco Use: High Risk (10/30/2023)    Readmission Risk Interventions     No data to display

## 2023-10-31 NOTE — Progress Notes (Addendum)
 Progress Note   [name] FMW:[id number] DOB: 08-Jul-1975 DOA: 10/30/2023     0 DOS: the patient was seen and examined on 10/31/2023   Brief hospital course: TYREL LEX is a 48 y.o. male with medical history significant of refractory HTN, IDDM, HLD, amphetamine abuse, presented with chest pain shortness of breath.  Upon arrival in the hospital, she had a blood pressure of 118/140, heart rate 115, no hypoxia. Creatinine 1.5, BNP 2220, troponin 57.  Patient is diagnosed with acute on chronic congestive heart failure, echocardiogram was performed.  Started on IV Lasix .   Principal Problem:   CHF (congestive heart failure) (HCC) Active Problems:   Hypertensive emergency   Protein-calorie malnutrition, severe (HCC)   Acute on chronic diastolic CHF (congestive heart failure) (HCC)   Amphetamine abuse (HCC)   Lung nodule   Uncontrolled type 2 diabetes mellitus with hyperglycemia, without long-term current use of insulin  (HCC)   Dysphagia   Assessment and Plan: Acute on chronic congestive heart failure, pending echocardiogram decide on ejection fraction. Hypertension emergency. History of cocaine abuse. Elevated troponin secondary to demand ischemia. Patient came to the hospital with significant shortness of breath, elevated BNP, chest x-ray showed significant vascular congestion.  Condition consistent with acute on chronic congestive heart failure, however, ejection fraction still pending to decide on ejection fraction.  Condition probably triggered by hypertensive emergency. Patient has been treated with IV Lasix , blood pressure is also better. Will continue current treatment, will obtain cardiology consult if ejection fraction is significantly reduced. He states that he is not currently using any drugs, he was taking Adderall, which is probably the cause of positive amphetamine. Given worsening renal function, relatively younger age, will obtain renal artery ultrasound to  rule out secondary hypertension due to renal artery stenosis.  Chest pain. CT angiogram chest/pelvis/abdomen did not show any aortic dissection.  Troponin only minimally elevated which appears to be secondary to demand ischemia.  Patient does has significant dysphagia and odynophagia.  chest pain could be due to esophageal abnormality.  Barium esophagram is ordered.  Acute kidney injury Hyponatremia. Hypokalemia. Patient renal function was higher at admission, but was normal before.  Creatinine 1.6 today after diuretics, will give a dose of albumin . Replete potassium. Continue to monitor sodium while giving diuretics.  Fluid striction for hyponatremia.  Severe protein calorie malnutrition. Dysphagia. Chronic constipation. TSH normal at time of admission.  Patient has been losing weight recently, he also complaining of significant dysphagia, will obtain barium esophagram. Patient appears likely malnourished with muscle atrophy.  Will start protein supplement.  Uncontrolled type 2 diabetes with hyperglycemia Check A1c, continue sliding scale insulin  for now.    Subjective:  Patient feels better today, still has some short of breath with exertion.  No chest pain.  Physical Exam: Vitals:   10/31/23 0500 10/31/23 0501 10/31/23 0504 10/31/23 0842  BP:  (!) 187/119 (!) 179/103 (!) 155/96  Pulse:  (!) 102 99 95  Resp:  18    Temp:  99.6 F (37.6 C)  98.1 F (36.7 C)  TempSrc:  Oral  Oral  SpO2:  95%  97%  Weight: 82.1 kg     Height:       General exam: Appears calm and comfortable, severely malnourished Respiratory system: Clear to auscultation. Respiratory effort normal. Cardiovascular system: S1 & S2 heard, RRR. No JVD, murmurs, rubs, gallops or clicks. No pedal edema. Gastrointestinal system: Abdomen is nondistended, soft and nontender. No organomegaly or masses felt. Normal bowel  sounds heard. Central nervous system: Alert and oriented. No focal neurological  deficits. Extremities: Significant muscle atrophy. Skin: No rashes, lesions or ulcers Psychiatry: Judgement and insight appear normal. Mood & affect appropriate.    Data Reviewed:  Reviewed CT scan results, lab results, ultrasound results.  Family Communication: None  Disposition: Status is: Observation Changed to inpatient status as patient stays longer than 2 midnight     Time spent: 55 minutes  Author: Murvin Mana, MD 10/31/2023 12:52 PM  For on call review [email or web address].

## 2023-10-31 NOTE — Progress Notes (Signed)
*  PRELIMINARY RESULTS* Echocardiogram 2D Echocardiogram has been performed.  Jonathon Page [name] 10/31/2023, 2:56 PM

## 2023-10-31 NOTE — Plan of Care (Signed)
  Problem: Education: Goal: Ability to describe self-care measures that may prevent or decrease complications (Diabetes Survival Skills Education) will improve Outcome: Progressing   Problem: Fluid Volume: Goal: Ability to maintain a balanced intake and output will improve Outcome: Progressing   Problem: Health Behavior/Discharge Planning: Goal: Ability to identify and utilize available resources and services will improve Outcome: Progressing   Problem: Health Behavior/Discharge Planning: Goal: Ability to manage health-related needs will improve Outcome: Progressing

## 2023-10-31 NOTE — Hospital Course (Addendum)
 Jonathon Page is a 48 y.o. male with medical history significant of refractory HTN, IDDM, HLD, amphetamine abuse, presented with chest pain shortness of breath.  Upon arrival in the hospital, she had a blood pressure of 118/140, heart rate 115, no hypoxia. Creatinine 1.5, BNP 2220, troponin 57.  Patient is diagnosed with acute on chronic congestive heart failure, echocardiogram was performed.  Started on IV Lasix .

## 2023-11-01 ENCOUNTER — Telehealth (HOSPITAL_COMMUNITY): Payer: Self-pay

## 2023-11-01 ENCOUNTER — Encounter: Payer: Self-pay | Admitting: Internal Medicine

## 2023-11-01 ENCOUNTER — Inpatient Hospital Stay

## 2023-11-01 ENCOUNTER — Other Ambulatory Visit (HOSPITAL_COMMUNITY): Payer: Self-pay

## 2023-11-01 DIAGNOSIS — I161 Hypertensive emergency: Secondary | ICD-10-CM | POA: Diagnosis not present

## 2023-11-01 DIAGNOSIS — E1165 Type 2 diabetes mellitus with hyperglycemia: Secondary | ICD-10-CM | POA: Diagnosis not present

## 2023-11-01 DIAGNOSIS — I5033 Acute on chronic diastolic (congestive) heart failure: Secondary | ICD-10-CM | POA: Diagnosis not present

## 2023-11-01 LAB — PHOSPHORUS: Phosphorus: 3.9 mg/dL (ref 2.5–4.6)

## 2023-11-01 LAB — BASIC METABOLIC PANEL WITH GFR
Anion gap: 11 (ref 5–15)
BUN: 36 mg/dL — ABNORMAL HIGH (ref 6–20)
CO2: 28 mmol/L (ref 22–32)
Calcium: 8.9 mg/dL (ref 8.9–10.3)
Chloride: 95 mmol/L — ABNORMAL LOW (ref 98–111)
Creatinine, Ser: 1.53 mg/dL — ABNORMAL HIGH (ref 0.61–1.24)
GFR, Estimated: 56 mL/min — ABNORMAL LOW (ref >60)
Glucose, Bld: 188 mg/dL — ABNORMAL HIGH (ref 70–99)
Potassium: 3.8 mmol/L (ref 3.5–5.1)
Sodium: 134 mmol/L — ABNORMAL LOW (ref 135–145)

## 2023-11-01 LAB — HEMOGLOBIN A1C
Hgb A1c MFr Bld: 7.5 % — ABNORMAL HIGH (ref 4.8–5.6)
Hgb A1c MFr Bld: 7.5 % — ABNORMAL HIGH (ref 4.8–5.6)
Mean Plasma Glucose: 169 mg/dL
Mean Plasma Glucose: 169 mg/dL

## 2023-11-01 LAB — GLUCOSE, CAPILLARY
Glucose-Capillary: 181 mg/dL — ABNORMAL HIGH (ref 70–99)
Glucose-Capillary: 287 mg/dL — ABNORMAL HIGH (ref 70–99)
Glucose-Capillary: 127 mg/dL — ABNORMAL HIGH (ref 70–99)
Glucose-Capillary: 216 mg/dL — ABNORMAL HIGH (ref 70–99)

## 2023-11-01 LAB — MAGNESIUM: Magnesium: 2.2 mg/dL (ref 1.7–2.4)

## 2023-11-01 MED ORDER — DAPAGLIFLOZIN PROPANEDIOL 10 MG PO TABS
10.0000 mg | ORAL_TABLET | Freq: Every day | ORAL | Status: DC
  Administered 2023-11-01 – 2023-11-04 (×4): 10 mg via ORAL
  Filled 2023-11-01 (×4): qty 1

## 2023-11-01 MED ORDER — HYDRALAZINE HCL 50 MG PO TABS
100.0000 mg | ORAL_TABLET | Freq: Three times a day (TID) | ORAL | Status: DC
  Administered 2023-11-01 – 2023-11-04 (×9): 100 mg via ORAL
  Filled 2023-11-01 (×9): qty 2

## 2023-11-01 MED ORDER — LACTULOSE 10 GM/15ML PO SOLN
30.0000 g | Freq: Once | ORAL | Status: AC
  Administered 2023-11-01: 30 g via ORAL
  Filled 2023-11-01: qty 60

## 2023-11-01 MED ORDER — SPIRONOLACTONE 12.5 MG HALF TABLET
12.5000 mg | ORAL_TABLET | Freq: Every day | ORAL | Status: DC
  Administered 2023-11-01 – 2023-11-03 (×3): 12.5 mg via ORAL
  Filled 2023-11-01 (×4): qty 1

## 2023-11-01 MED ORDER — CARVEDILOL 12.5 MG PO TABS
12.5000 mg | ORAL_TABLET | Freq: Two times a day (BID) | ORAL | Status: DC
  Administered 2023-11-01 – 2023-11-02 (×2): 12.5 mg via ORAL
  Filled 2023-11-01 (×2): qty 1

## 2023-11-01 NOTE — Progress Notes (Signed)
 Heart Failure Stewardship Pharmacy Note  PCP: [facility], [facility] PCP-Cardiologist: None  HPI: Jonathon Page is a 48 y.o. male with HTN, DM, HLD, amphetamine use who presented with chest pain and shortness of breath.On admission, BNP was 2220.6, HS-troponin was 57, PCT was <0.1 and UDS positive for amphetamines. Chest x-ray noted pulmonary vascular congestion without overt pulmonary edema. Repeat chest x-ray noted mild pulmonary vascular congestion and new right pleural effusion. Echocardiogram showed LVEF of 45-50% with moderate concentric hypertrophy, grade III diastolic dysfunction, low normal RV function, moderate TR. Read recommended consideration for infiltrative disease.  Pertinent Lab Values: Creatinine, Ser  Date Value Ref Range Status  11/01/2023 1.53 (H) 0.61 - 1.24 mg/dL Final   BUN  Date Value Ref Range Status  11/01/2023 36 (H) 6 - 20 mg/dL Final   Potassium  Date Value Ref Range Status  11/01/2023 3.8 3.5 - 5.1 mmol/L Final   Sodium  Date Value Ref Range Status  11/01/2023 134 (L) 135 - 145 mmol/L Final   B Natriuretic Peptide  Date Value Ref Range Status  10/30/2023 2,220.6 (H) 0.0 - 100.0 pg/mL Final    Comment:    Performed at [facility], [address] [place], [place] [address]   Magnesium   Date Value Ref Range Status  11/01/2023 2.2 1.7 - 2.4 mg/dL Final    Comment:    Performed at [facility], [address] [place], [place] [address]   Hgb A1c MFr Bld  Date Value Ref Range Status  10/30/2023 7.5 (H) 4.8 - 5.6 % Final    Comment:    (NOTE)         Prediabetes: 5.7 - 6.4         Diabetes: >6.4         Glycemic control for adults with diabetes: <7.0    TSH  Date Value Ref Range Status  10/30/2023 0.842 0.350 - 4.500 uIU/mL Final    Comment:    Performed by a 3rd Generation assay with a functional sensitivity of <=0.01 uIU/mL. Performed at [facility], [address] [place], [place] [address]      Vital Signs:  Temp:  [97.5 F (36.4 C)-98.5 F (36.9 C)] 97.9 F (36.6 C) (07/14 1248) Pulse Rate:  [83-98] 86 (07/14 1248) Cardiac Rhythm: Normal sinus rhythm (07/14 0700) Resp:  [14-20] 18 (07/14 1248) BP: (115-187)/(70-119) 167/110 (07/14 1248) SpO2:  [95 %-100 %] 98 % (07/14 1248) Weight:  [82.5 kg (181 lb 14.1 oz)] 82.5 kg (181 lb 14.1 oz) (07/14 0425)  Intake/Output Summary (Last 24 hours) at 11/01/2023 1435 Last data filed at 11/01/2023 1032 Gross per 24 hour  Intake 240 ml  Output 900 ml  Net -660 ml    Current Heart Failure Medications:  Loop diuretic: furosemide  40 mg IV BID Beta-Blocker: carvedilol  12.5 mg BID ACEI/ARB/ARNI: none MRA: spironolactone  12.5 mg daily SGLT2i: Farxiga  10 mg daily Other: none  Prior to admission Heart Failure Medications:  Loop diuretic: none Beta-Blocker: none ACEI/ARB/ARNI: none MRA: none SGLT2i: none Other: hydrochlorothiazide  25 mg daily, clonidine  0.2 mg patch  Assessment: 1. 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 NYHA class II-III symptoms.  -Symptoms: Denies shortness of breath at rest. Reports difficulty swallowing. Reports abdominal discomfort due to lack of BM. -Volume: Appears to be mildly hypervolemic. No LEE. Creatinine improved this AM, though BUN trending up. Continue furosemide  40 mg IV BID. -Hemodynamics: BP  is severely elevated. Hydrochlorothiazide  and clonidine  being held.  -BB: Continue carvedilol  12.5 mg BID. -ACEI/ARB/ARNI: Patient has a documented history of anaphylaxis to ACEi. Avoid Entresto . Can consider ARB in the future. -MRA: Spironolactone  12.5 mg daily started. Can increase dose tomorrow. -SGLT2i: Farxiga  10 mg daily started.  Plan: 1) Medication changes recommended at this time: -Consider increasing spironolactone  to 25 mg daily tomorrow.   2) Patient assistance: -Copay for Farxiga  and Jardiance is $0  3)  Education: - Patient has been educated on current HF medications and potential additions to HF medication regimen - Patient verbalizes understanding that over the next few months, these medication doses may change and more medications may be added to optimize HF regimen - Patient has been educated on basic disease state pathophysiology and goals of therapy  Medication Assistance / Insurance Benefits Check: Does the patient have prescription insurance?    Type of insurance plan:  Does the patient qualify for medication assistance through manufacturers or grants? No   Outpatient Pharmacy: Prior to admission outpatient pharmacy: [facility]      Please do not hesitate to reach out with questions or concerns,  Jaun Bash, PharmD, CPP, BCPS, [facility] Heart Failure Pharmacist  Phone - [phone] 11/01/2023 2:35 PM
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
## 2023-11-01 NOTE — Progress Notes (Addendum)
 Progress Note   [name] FMW:[id number] DOB: 1975-06-23 DOA: 10/30/2023     1 DOS: the patient was seen and examined on 11/01/2023   Brief hospital course: Jonathon Page is a 48 y.o. male with medical history significant of refractory HTN, IDDM, HLD, amphetamine abuse, presented with chest pain shortness of breath.  Upon arrival in the hospital, she had a blood pressure of 118/140, heart rate 115, no hypoxia. Creatinine 1.5, BNP 2220, troponin 57.  Patient is diagnosed with acute on chronic congestive heart failure, echocardiogram was performed.  Started on IV Lasix .   Principal Problem:   CHF (congestive heart failure) (HCC) Active Problems:   Hypertensive emergency   Protein-calorie malnutrition, severe (HCC)   Acute on chronic diastolic CHF (congestive heart failure) (HCC)   Amphetamine abuse (HCC)   Lung nodule   Uncontrolled type 2 diabetes mellitus with hyperglycemia, without long-term current use of insulin  (HCC)   Dysphagia   CHF, acute on chronic (HCC)   Assessment and Plan: Acute on chronic congestive heart failure, pending echocardiogram decide on ejection fraction. Hypertension emergency. History of cocaine abuse. Elevated troponin secondary to demand ischemia. Patient came to the hospital with significant shortness of breath, elevated BNP, chest x-ray showed significant vascular congestion.  Condition consistent with acute on chronic congestive heart failure, however, ejection fraction still pending to decide on ejection fraction.  Condition probably triggered by hypertensive emergency. He states that he is not currently using any drugs, he was taking Adderall, which is probably the cause of positive amphetamine. Patient had a CT angiogram, did not show any renal artery stenosis. Patient still has significant shortness of breath, continue IV Lasix .  Starting GDMT; added Aldactone , Farxiga , could not start Entresto  due to history of anaphylactic reaction to  ACE inhibitor. Patient is also started on hydralazine  and Imdur .  Blood pressure still running high, hydralazine  will be increased to 100 mg every 8 hours. Continue to follow patient closely.   Chest pain. CT angiogram chest/pelvis/abdomen did not show any aortic dissection.  Troponin only minimally elevated which appears to be secondary to demand ischemia.  Patient does has significant dysphagia and odynophagia.  chest pain could be due to esophageal abnormality.  Barium esophagram is ordered.   Acute kidney injury Hyponatremia. Hypokalemia. Patient renal function was higher at admission, but was normal before.  Creatinine 1.6 today after diuretics, received albumin .  Renal function stable.  Monitor renal function while on diuretics. Potassium has normalized. On fluid restriction for hyponatremia.   Severe protein calorie malnutrition. Dysphagia. Chronic constipation. TSH normal at time of admission.  Patient has been losing weight recently, he also complaining of significant dysphagia, barium esophagram pending. Patient appears severely malnourished with muscle atrophy.  Continue protein supplement. Patient also has chronic constipation, received lactulose  yesterday, no bowel movement, will give higher dose of lactulose , continue senna.   Uncontrolled type 2 diabetes with hyperglycemia A1c 7.5, continue sliding scale insulin  for now.  Also added Farxiga   Lung nodule. Incidental finding chest CT scan, we will follow-up with PCP for repeat CT scan in the future.       Subjective:  Patient still has significant short of breath with exertion, but appears to be improving.  Still complaining of dysphagia.  Still constipated  Physical Exam: Vitals:   11/01/23 0419 11/01/23 0425 11/01/23 0512 11/01/23 0905  BP: (!) 187/119  115/70 (!) 170/99  Pulse: 94  83 90  Resp: 14   18  Temp: 98 F (36.7  C)   97.7 F (36.5 C)  TempSrc: Oral     SpO2: 99%   100%  Weight:  82.5 kg     Height:       General exam: Appears calm and comfortable, severely malnourished. Respiratory system: No Crackles today. Respiratory effort normal. Cardiovascular system: S1 & S2 heard, RRR. No JVD, murmurs, rubs, gallops or clicks. No pedal edema. Gastrointestinal system: Abdomen is nondistended, soft and nontender. No organomegaly or masses felt. Normal bowel sounds heard. Central nervous system: Alert and oriented. No focal neurological deficits. Extremities: Symmetric 5 x 5 power, severe muscle atrophy. Skin: No rashes, lesions or ulcers Psychiatry: Judgement and insight appear normal. Mood & affect appropriate.    Data Reviewed:  Reviewed imaging studies, lab results.  Family Communication: Fianc updated at bedside.  Disposition: Status is: Inpatient Remains inpatient appropriate because: Severity of disease, IV treatment.     Time spent: 55 minutes  Author: Murvin Mana, MD 11/01/2023 11:06 AM  For on call review [email or web address].

## 2023-11-01 NOTE — Plan of Care (Signed)
  Problem: Education: Goal: Ability to describe self-care measures that may prevent or decrease complications (Diabetes Survival Skills Education) will improve Outcome: Progressing   Problem: Coping: Goal: Ability to adjust to condition or change in health will improve Outcome: Progressing   Problem: Health Behavior/Discharge Planning: Goal: Ability to identify and utilize available resources and services will improve Outcome: Progressing

## 2023-11-01 NOTE — Telephone Encounter (Signed)
 Pharmacy Patient Advocate Encounter  Insurance verification completed.    The patient is insured through [facility]. Patient has Medicare and is not eligible for a copay card, but may be able to apply for patient assistance or Medicare RX Payment Plan (Patient Must reach out to their plan, if eligible for payment plan), if available.    Ran test claim for Farxiga  and the current 30 day co-pay is $0.00.  Ran test claim for Jardiance and the current 30 day co-pay is $0.00.  Ran test claim for Entresto  and the current 30 day co-pay is $0.00.  This test claim was processed through [facility]- copay amounts may vary at other pharmacies due to pharmacy/plan contracts, or as the patient moves through the different stages of their insurance plan.

## 2023-11-02 DIAGNOSIS — I5033 Acute on chronic diastolic (congestive) heart failure: Secondary | ICD-10-CM | POA: Diagnosis not present

## 2023-11-02 DIAGNOSIS — E1165 Type 2 diabetes mellitus with hyperglycemia: Secondary | ICD-10-CM | POA: Diagnosis not present

## 2023-11-02 DIAGNOSIS — I161 Hypertensive emergency: Secondary | ICD-10-CM | POA: Diagnosis not present

## 2023-11-02 DIAGNOSIS — K222 Esophageal obstruction: Secondary | ICD-10-CM | POA: Diagnosis not present

## 2023-11-02 DIAGNOSIS — R1310 Dysphagia, unspecified: Secondary | ICD-10-CM

## 2023-11-02 LAB — CBC
HCT: 39.1 % (ref 39.0–52.0)
Hemoglobin: 13.1 g/dL (ref 13.0–17.0)
MCH: 29.3 pg (ref 26.0–34.0)
MCHC: 33.5 g/dL (ref 30.0–36.0)
MCV: 87.5 fL (ref 80.0–100.0)
Platelets: 237 K/uL (ref 150–400)
RBC: 4.47 MIL/uL (ref 4.22–5.81)
RDW: 14.9 % (ref 11.5–15.5)
WBC: 8.5 K/uL (ref 4.0–10.5)
nRBC: 0 % (ref 0.0–0.2)

## 2023-11-02 LAB — BASIC METABOLIC PANEL WITH GFR
Anion gap: 12 (ref 5–15)
BUN: 42 mg/dL — ABNORMAL HIGH (ref 6–20)
CO2: 27 mmol/L (ref 22–32)
Calcium: 8.9 mg/dL (ref 8.9–10.3)
Chloride: 95 mmol/L — ABNORMAL LOW (ref 98–111)
Creatinine, Ser: 1.65 mg/dL — ABNORMAL HIGH (ref 0.61–1.24)
GFR, Estimated: 52 mL/min — ABNORMAL LOW (ref >60)
Glucose, Bld: 206 mg/dL — ABNORMAL HIGH (ref 70–99)
Potassium: 3.6 mmol/L (ref 3.5–5.1)
Sodium: 134 mmol/L — ABNORMAL LOW (ref 135–145)

## 2023-11-02 LAB — GLUCOSE, CAPILLARY
Glucose-Capillary: 187 mg/dL — ABNORMAL HIGH (ref 70–99)
Glucose-Capillary: 258 mg/dL — ABNORMAL HIGH (ref 70–99)
Glucose-Capillary: 116 mg/dL — ABNORMAL HIGH (ref 70–99)
Glucose-Capillary: 223 mg/dL — ABNORMAL HIGH (ref 70–99)

## 2023-11-02 LAB — MAGNESIUM: Magnesium: 2.3 mg/dL (ref 1.7–2.4)

## 2023-11-02 MED ORDER — CARVEDILOL 25 MG PO TABS
25.0000 mg | ORAL_TABLET | Freq: Two times a day (BID) | ORAL | Status: DC
  Administered 2023-11-02 – 2023-11-04 (×4): 25 mg via ORAL
  Filled 2023-11-02 (×4): qty 1

## 2023-11-02 MED ORDER — ALBUMIN HUMAN 25 % IV SOLN
25.0000 g | Freq: Once | INTRAVENOUS | Status: AC
  Administered 2023-11-02: 25 g via INTRAVENOUS
  Filled 2023-11-02 (×2): qty 100

## 2023-11-02 MED ORDER — ACETAMINOPHEN 325 MG PO TABS: 650.0000 mg | ORAL_TABLET | ORAL | Status: DC | PRN

## 2023-11-02 MED ORDER — FLEET ENEMA RE ENEM
1.0000 | ENEMA | Freq: Once | RECTAL | Status: AC
  Administered 2023-11-02: 1 via RECTAL

## 2023-11-02 MED ORDER — LACTULOSE 10 GM/15ML PO SOLN
30.0000 g | Freq: Once | ORAL | Status: AC
  Administered 2023-11-02: 30 g via ORAL
  Filled 2023-11-02: qty 60

## 2023-11-02 NOTE — Progress Notes (Signed)
 ReDS Vest / Clip - 11/02/23 1305       ReDS Vest / Clip   Station Marker C    Ruler Value 25    ReDS Value Range Low volume    ReDS Actual Value 28         1st reading 30 & 2nd reading 28  Charmaine Pines, RN, BSN [facility] Heart Failure Navigator Secure Chat Only

## 2023-11-02 NOTE — Plan of Care (Signed)
   Problem: Coping: Goal: Ability to adjust to condition or change in health will improve Outcome: Progressing   Problem: Fluid Volume: Goal: Ability to maintain a balanced intake and output will improve Outcome: Progressing   Problem: Health Behavior/Discharge Planning: Goal: Ability to identify and utilize available resources and services will improve Outcome: Progressing

## 2023-11-02 NOTE — Plan of Care (Signed)

## 2023-11-02 NOTE — Progress Notes (Signed)
 Heart Failure Stewardship Pharmacy Note  PCP: [facility] PCP-Cardiologist: None  HPI: Jonathon Page is a 48 y.o. male with HTN, DM, HLD, amphetamine use who presented with chest pain and shortness of breath.On admission, BNP was 2220.6, HS-troponin was 57, PCT was <0.1 and UDS positive for amphetamines. Chest x-ray noted pulmonary vascular congestion without overt pulmonary edema. Repeat chest x-ray noted mild pulmonary vascular congestion and new right pleural effusion. Echocardiogram showed LVEF of 45-50% with moderate concentric hypertrophy, grade III diastolic dysfunction, low normal RV function, moderate TR. Read recommended consideration for infiltrative disease. Barium swallow showed esophageal stricture. GI consulted for evaluation.  Pertinent Lab Values: Creatinine, Ser  Date Value Ref Range Status  11/02/2023 1.65 (H) 0.61 - 1.24 mg/dL Final   BUN  Date Value Ref Range Status  11/02/2023 42 (H) 6 - 20 mg/dL Final   Potassium  Date Value Ref Range Status  11/02/2023 3.6 3.5 - 5.1 mmol/L Final   Sodium  Date Value Ref Range Status  11/02/2023 134 (L) 135 - 145 mmol/L Final   B Natriuretic Peptide  Date Value Ref Range Status  10/30/2023 2,220.6 (H) 0.0 - 100.0 pg/mL Final    Comment:    Performed at [facility], [address] [place], [place] [address]   Magnesium   Date Value Ref Range Status  11/02/2023 2.3 1.7 - 2.4 mg/dL Final    Comment:    Performed at [facility], [address] [place], [place] [address]   Hgb A1c MFr Bld  Date Value Ref Range Status  11/01/2023 7.5 (H) 4.8 - 5.6 % Final    Comment:    (NOTE)         Prediabetes: 5.7 - 6.4         Diabetes: >6.4         Glycemic control for adults with diabetes: <7.0    TSH  Date Value Ref Range Status  10/30/2023 0.842 0.350 - 4.500 uIU/mL Final    Comment:    Performed by a 3rd Generation assay with a functional sensitivity of <=0.01 uIU/mL. Performed at  [facility], [address] [place], [place] [address]     Vital Signs:  Temp:  [97.7 F (36.5 C)-98.7 F (37.1 C)] 98.6 F (37 C) (07/15 0747) Pulse Rate:  [84-93] 93 (07/15 0747) Cardiac Rhythm: Normal sinus rhythm;Bundle branch block (07/14 1901) Resp:  [17-18] 17 (07/15 0747) BP: (138-194)/(91-115) 194/105 (07/15 0747) SpO2:  [96 %-100 %] 98 % (07/15 0747) Weight:  [82.4 kg (181 lb 9.6 oz)] 82.4 kg (181 lb 9.6 oz) (07/15 0500)  Intake/Output Summary (Last 24 hours) at 11/02/2023 0759 Last data filed at 11/01/2023 2200 Gross per 24 hour  Intake 240 ml  Output 1200 ml  Net -960 ml    Current Heart Failure Medications:  Loop diuretic: furosemide  40 mg IV BID Beta-Blocker: carvedilol  12.5 mg BID ACEI/ARB/ARNI: none MRA: spironolactone  12.5 mg daily SGLT2i: Farxiga  10 mg daily Other: none  Prior to admission Heart Failure Medications:  Loop diuretic: none Beta-Blocker: none ACEI/ARB/ARNI: none MRA: none SGLT2i: none Other: hydrochlorothiazide  25 mg daily, clonidine  0.2 mg patch  Assessment: 1. 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 NYHA class II-III symptoms.  -Symptoms: Denies shortness of breath at rest. Reports difficulty swallowing. Reports abdominal discomfort due to lack of BM in >1 week. -Volume: Appears to be euvolemic. No LEE. No JVP elevation.  Creatinine up this AM and BUN trending up. Consider holding furosemide  today. -Hemodynamics: BP is severely elevated. Hydrochlorothiazide  and clonidine  being held.  -BB: Continue carvedilol  25 mg BID. -ACEI/ARB/ARNI: Patient has a documented history of anaphylaxis to ACEi. Avoid Entresto . Can consider ARB in the future. -MRA: Spironolactone  12.5 mg daily started. Can increase dose tomorrow if creatinine trends down. -SGLT2i: Farxiga  10 mg daily started.  Plan: 1) Medication changes recommended at this time: - Consider  adding bowel regimen. - Will check REDS. If <35%, consider holding furosemide  today.  2) Patient assistance: -Copay for Farxiga  and Jardiance is $0  3) Education: - Patient has been educated on current HF medications and potential additions to HF medication regimen - Patient verbalizes understanding that over the next few months, these medication doses may change and more medications may be added to optimize HF regimen - Patient has been educated on basic disease state pathophysiology and goals of therapy  Medication Assistance / Insurance Benefits Check: Does the patient have prescription insurance?    Type of insurance plan:  Does the patient qualify for medication assistance through manufacturers or grants? No   Outpatient Pharmacy: Prior to admission outpatient pharmacy: [facility]      Please do not hesitate to reach out with questions or concerns,  Jaun Bash, PharmD, CPP, BCPS, [facility] Heart Failure Pharmacist  Phone - [phone] 11/02/2023 7:59 AM
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
## 2023-11-02 NOTE — Progress Notes (Signed)
 Progress Note   [name] FMW:[id number] DOB: April 04, 1976 DOA: 10/30/2023     2 DOS: the patient was seen and examined on 11/02/2023   Brief hospital course: Jonathon Page is a 48 y.o. male with medical history significant of refractory HTN, IDDM, HLD, amphetamine abuse, presented with chest pain shortness of breath.  Upon arrival in the hospital, she had a blood pressure of 118/140, heart rate 115, no hypoxia. Creatinine 1.5, BNP 2220, troponin 57.  Patient is diagnosed with acute on chronic congestive heart failure, echocardiogram was performed.  Started on IV Lasix .   Principal Problem:   CHF (congestive heart failure) (HCC) Active Problems:   Hypertensive emergency   Protein-calorie malnutrition, severe (HCC)   Acute on chronic diastolic CHF (congestive heart failure) (HCC)   Amphetamine abuse (HCC)   Lung nodule   Uncontrolled type 2 diabetes mellitus with hyperglycemia, without long-term current use of insulin  (HCC)   Dysphagia   CHF, acute on chronic (HCC)   Assessment and Plan: Acute on chronic congestive heart failure, pending echocardiogram decide on ejection fraction. Hypertension emergency. History of cocaine abuse. Elevated troponin secondary to demand ischemia. Patient came to the hospital with significant shortness of breath, elevated BNP, chest x-ray showed significant vascular congestion.  Condition consistent with acute on chronic congestive heart failure, however, ejection fraction still pending to decide on ejection fraction.  Condition probably triggered by hypertensive emergency. He states that he is not currently using any drugs, he was taking Adderall, which is probably the cause of positive amphetamine. Patient had a CT angiogram, did not show any renal artery stenosis. Patient still had significant shortness of breath, treated with IV Lasix .  Starting GDMT; added Aldactone , Farxiga ,Coreg ; could not start Entresto  due to history of anaphylactic  reaction to ACE inhibitor. Patient is also started on hydralazine  and Imdur .  Blood pressure still running high, hydralazine  will be increased to 100 mg every 8 hours yesterday.  Pressure still running high, increase Coreg  to 25 mg twice a day Volume status much improved today, discontinued IV Lasix .  Recheck BMP tomorrow.    Chest pain. CT angiogram chest/pelvis/abdomen did not show any aortic dissection.  Troponin only minimally elevated which appears to be secondary to demand ischemia.  Patient does has significant dysphagia and odynophagia.  chest pain could be due to esophageal abnormality.  Barium esophagram showed esophageal stricture   Acute kidney injury Hyponatremia. Hypokalemia. Patient Creatinine was higher at admission, but was normal before.   On fluid restriction for hyponatremia. Gave 25 g albumin  for slightly  worsening renal function.  Check BMP tomorrow.   Severe protein calorie malnutrition. Dysphagia. Chronic constipation. TSH normal at time of admission.  Patient has been losing weight recently, he also complaining of significant dysphagia, barium esophagram pending. Patient appears severely malnourished with muscle atrophy.  Continue protein supplement. Patient also has chronic constipation, started on senna, received lactulose , still has constipation.  Will increase dose of lactulose .   Uncontrolled type 2 diabetes with hyperglycemia A1c 7.5, continue sliding scale insulin .  Also added Farxiga .  Continue to follow.   Lung nodule. Incidental finding chest CT scan, we will follow-up with PCP for repeat CT scan in the future.         Subjective:  Feels better, less short of breath.  Still feel constipated.  Physical Exam: Vitals:   11/02/23 0747 11/02/23 0842 11/02/23 1012 11/02/23 1102  BP: (!) 194/105 (!) 189/103 113/61 134/80  Pulse: 93 93 82 87  Resp: 17   18  Temp: 98.6 F (37 C)   98.8 F (37.1 C)  TempSrc:      SpO2: 98%   95%  Weight:       Height:       General exam: Appears calm and comfortable  Respiratory system: Clear to auscultation. Respiratory effort normal. Cardiovascular system: S1 & S2 heard, RRR. No JVD, murmurs, rubs, gallops or clicks. No pedal edema. Gastrointestinal system: Abdomen is nondistended, soft and nontender. No organomegaly or masses felt. Normal bowel sounds heard. Central nervous system: Alert and oriented. No focal neurological deficits. Extremities: Symmetric 5 x 5 power. Skin: No rashes, lesions or ulcers Psychiatry: Judgement and insight appear normal. Mood & affect appropriate.    Data Reviewed:  Lab results reviewed.  Family Communication: None  Disposition: Status is: Inpatient Remains inpatient appropriate because: Severity of disease, inpatient procedure     Time spent: 35 minutes  Author: Murvin Mana, MD 11/02/2023 1:17 PM  For on call review [email or web address].

## 2023-11-03 ENCOUNTER — Inpatient Hospital Stay: Admitting: Anesthesiology

## 2023-11-03 ENCOUNTER — Encounter: Payer: Self-pay | Admitting: Gastroenterology

## 2023-11-03 ENCOUNTER — Encounter
Admission: EM | Disposition: A | Discharge status: Home / Self Care | Payer: Self-pay | Source: Home / Self Care | Attending: Internal Medicine

## 2023-11-03 DIAGNOSIS — K222 Esophageal obstruction: Secondary | ICD-10-CM | POA: Diagnosis not present

## 2023-11-03 DIAGNOSIS — K2289 Other specified disease of esophagus: Secondary | ICD-10-CM | POA: Diagnosis not present

## 2023-11-03 DIAGNOSIS — I5033 Acute on chronic diastolic (congestive) heart failure: Secondary | ICD-10-CM | POA: Diagnosis not present

## 2023-11-03 HISTORY — PX: ESOPHAGOGASTRODUODENOSCOPY: SHX5428

## 2023-11-03 LAB — GLUCOSE, CAPILLARY
Glucose-Capillary: 166 mg/dL — ABNORMAL HIGH (ref 70–99)
Glucose-Capillary: 216 mg/dL — ABNORMAL HIGH (ref 70–99)
Glucose-Capillary: 156 mg/dL — ABNORMAL HIGH (ref 70–99)
Glucose-Capillary: 135 mg/dL — ABNORMAL HIGH (ref 70–99)
Glucose-Capillary: 239 mg/dL — ABNORMAL HIGH (ref 70–99)

## 2023-11-03 LAB — MAGNESIUM: Magnesium: 2.5 mg/dL — ABNORMAL HIGH (ref 1.7–2.4)

## 2023-11-03 LAB — BASIC METABOLIC PANEL WITH GFR
Anion gap: 10 (ref 5–15)
BUN: 42 mg/dL — ABNORMAL HIGH (ref 6–20)
CO2: 28 mmol/L (ref 22–32)
Calcium: 9.2 mg/dL (ref 8.9–10.3)
Chloride: 98 mmol/L (ref 98–111)
Creatinine, Ser: 1.65 mg/dL — ABNORMAL HIGH (ref 0.61–1.24)
GFR, Estimated: 52 mL/min — ABNORMAL LOW (ref >60)
Glucose, Bld: 166 mg/dL — ABNORMAL HIGH (ref 70–99)
Potassium: 3.9 mmol/L (ref 3.5–5.1)
Sodium: 136 mmol/L (ref 135–145)

## 2023-11-03 SURGERY — EGD (ESOPHAGOGASTRODUODENOSCOPY)
Anesthesia: General

## 2023-11-03 MED ORDER — BISACODYL 5 MG PO TBEC
10.0000 mg | DELAYED_RELEASE_TABLET | Freq: Every day | ORAL | Status: DC
  Administered 2023-11-03: 10 mg via ORAL
  Filled 2023-11-03: qty 2

## 2023-11-03 MED ORDER — BISACODYL 5 MG PO TBEC
10.0000 mg | DELAYED_RELEASE_TABLET | Freq: Once | ORAL | Status: AC
  Administered 2023-11-03: 10 mg via ORAL
  Filled 2023-11-03: qty 2

## 2023-11-03 MED ORDER — PROPOFOL 10 MG/ML IV BOLUS
INTRAVENOUS | Status: DC | PRN
  Administered 2023-11-03 (×3): 20 mg via INTRAVENOUS
  Administered 2023-11-03: 40 mg via INTRAVENOUS

## 2023-11-03 MED ORDER — BISACODYL 10 MG RE SUPP: 10.0000 mg | Freq: Once | RECTAL | Status: DC

## 2023-11-03 MED ORDER — BISACODYL 10 MG RE SUPP: 10.0000 mg | Freq: Every day | RECTAL | Status: DC | PRN

## 2023-11-03 MED ORDER — ENOXAPARIN SODIUM 40 MG/0.4ML IJ SOSY
40.0000 mg | PREFILLED_SYRINGE | Freq: Every evening | INTRAMUSCULAR | Status: DC
  Administered 2023-11-03: 40 mg via SUBCUTANEOUS
  Filled 2023-11-03: qty 0.4

## 2023-11-03 MED ORDER — SODIUM CHLORIDE 0.9 % IV SOLN: INTRAVENOUS | Status: DC

## 2023-11-03 MED ORDER — PHENOL 1.4 % MT LIQD
1.0000 | OROMUCOSAL | Status: DC | PRN
  Administered 2023-11-03: 1 via OROMUCOSAL
  Filled 2023-11-03: qty 177

## 2023-11-03 MED ORDER — AMLODIPINE BESYLATE 10 MG PO TABS
10.0000 mg | ORAL_TABLET | Freq: Every evening | ORAL | Status: DC
  Administered 2023-11-03: 10 mg via ORAL
  Filled 2023-11-03: qty 1

## 2023-11-03 MED ORDER — ISOSORBIDE MONONITRATE ER 60 MG PO TB24
60.0000 mg | ORAL_TABLET | Freq: Every day | ORAL | Status: DC
  Administered 2023-11-04: 60 mg via ORAL
  Filled 2023-11-03: qty 1

## 2023-11-03 MED ORDER — AMLODIPINE BESYLATE 10 MG PO TABS: 10.0000 mg | ORAL_TABLET | Freq: Every day | ORAL | Status: DC

## 2023-11-03 MED ORDER — POLYETHYLENE GLYCOL 3350 17 G PO PACK
17.0000 g | PACK | Freq: Two times a day (BID) | ORAL | Status: DC
  Filled 2023-11-03: qty 1

## 2023-11-03 NOTE — Anesthesia Postprocedure Evaluation (Signed)
 Anesthesia Post Note  [name]  Procedure(s) Performed: EGD (ESOPHAGOGASTRODUODENOSCOPY)  Patient location during evaluation: PACU Anesthesia Type: General Level of consciousness: awake and alert Pain management: pain level controlled Vital Signs Assessment: post-procedure vital signs reviewed and stable Respiratory status: spontaneous breathing, nonlabored ventilation, respiratory function stable and patient connected to nasal cannula oxygen Cardiovascular status: blood pressure returned to baseline and stable Postop Assessment: no apparent nausea or vomiting Anesthetic complications: no   No notable events documented.   Last Vitals:  Vitals:   11/03/23 1608 11/03/23 2010  BP: (!) 157/88 (!) 149/83  Pulse: 70 79  Resp:  17  Temp: 36.4 C 36.6 C  SpO2: 99% 97%    Last Pain:  Vitals:   11/03/23 2043  TempSrc:   PainSc: 10-Worst pain ever                 Lynwood KANDICE Clause

## 2023-11-03 NOTE — Transfer of Care (Signed)
 Immediate Anesthesia Transfer of Care Note  [name]  Procedure(s) Performed: EGD (ESOPHAGOGASTRODUODENOSCOPY)  Patient Location: PACU  Anesthesia Type:General  Level of Consciousness: drowsy and patient cooperative  Airway & Oxygen Therapy: Patient Spontanous Breathing  Post-op Assessment: Report given to RN and Post -op Vital signs reviewed and stable  Post vital signs: Reviewed and stable  Last Vitals:  Vitals Value Taken Time  BP 140/81 (96)   Temp    Pulse 64 11/03/23 14:51  Resp 13 11/03/23 14:51  SpO2 99 % 11/03/23 14:51  Vitals shown include unfiled device data.  Last Pain:  Vitals:   11/03/23 1402  TempSrc: Temporal  PainSc: 5          Complications: No notable events documented.

## 2023-11-03 NOTE — Op Note (Signed)
 [facility]enter[facility] Patient Name: Jonathon Page Procedure Date: 11/03/2023 2:29 PM MRN: [id number] Account #: [id number] Date of Birth: 06-Apr-1976 Admit Type: Inpatient Age: 48 Room: [facility] ENDO ROOM 3 Gender: Male Note Status: Finalized Instrument Name: Upper Endoscope [id number] Procedure:             Upper GI endoscopy Indications:           Dysphagia Providers:             Rogelia Copping MD, MD Medicines:             Propofol  per Anesthesia Complications:         No immediate complications. Procedure:             Pre-Anesthesia Assessment:                        - Prior to the procedure, a History and Physical was                         performed, and patient medications and allergies were                         reviewed. The patient's tolerance of previous                         anesthesia was also reviewed. The risks and benefits                         of the procedure and the sedation options and risks                         were discussed with the patient. All questions were                         answered, and informed consent was obtained. Prior                         Anticoagulants: The patient has taken Lovenox                          (enoxaparin ), last dose was 1 day prior to procedure.                         ASA Grade Assessment: III - A patient with severe                         systemic disease. After reviewing the risks and                         benefits, the patient was deemed in satisfactory                         condition to undergo the procedure.                        After obtaining informed consent, the endoscope was                         passed under direct vision. Throughout the  procedure,                         the patient's blood pressure, pulse, and oxygen                         saturations were monitored continuously. The Endoscope                         was introduced through the mouth, and advanced to the                          second part of duodenum. The upper GI endoscopy was                         accomplished without difficulty. The patient tolerated                         the procedure well. Findings:      One benign-appearing, intrinsic severe stenosis was found in the upper       third of the esophagus. This stenosis measured 1 cm (inner diameter).       The stenosis was traversed after dilation. A TTS dilator was passed       through the scope. Dilation with a 12-13.5-15 mm balloon dilator was       performed to 12 mm. The dilation site was examined following endoscope       reinsertion and showed moderate mucosal disruption. Biopsies were       obtained in the middle third of the esophagus with cold forceps for       histology.      The stomach was normal.      The examined duodenum was normal. Impression:            - Benign-appearing esophageal stenosis. Dilated.                        - Normal stomach.                        - Normal examined duodenum.                        - Biopsies were obtained in the middle third of the                         esophagus. Recommendation:        - Return patient to hospital ward for ongoing care.                        - Mechanical soft diet.                        - Await pathology results.                        - Repeat upper endoscopy in 4 weeks for retreatment. Procedure Code(s):     --- Professional ---                        [phone], Esophagogastroduodenoscopy, flexible,  transoral; with transendoscopic balloon dilation of                         esophagus (less than 30 mm diameter)                        43239, 59, Esophagogastroduodenoscopy, flexible,                         transoral; with biopsy, single or multiple Diagnosis Code(s):     --- Professional ---                        R13.10, Dysphagia, unspecified                        K22.2, Esophageal obstruction CPT copyright 2022 [employer].  All rights reserved. The codes documented in this report are preliminary and upon coder review may  be revised to meet current compliance requirements. Rogelia Copping MD, MD 11/03/2023 2:51:21 PM This report has been signed electronically. Number of Addenda: 0 Note Initiated On: 11/03/2023 2:29 PM Estimated Blood Loss:  Estimated blood loss: none.      [facility]

## 2023-11-03 NOTE — Progress Notes (Signed)
 The had an EGD today with a esophageal stricture seen in the proximal esophagus that was dilated to 12 mm with a balloon.  The patient will need a repeat EGD with dilation in 4 weeks.  The patient should be on a soft diet until then.  Nothing further to do from GI point of view this admission.  The patient's Lovenox  can be restarted tomorrow if needed.  I will sign off.  Please call if any further GI concerns or questions.  We would like to thank you for the opportunity to participate in the care of Jonathon Page.

## 2023-11-03 NOTE — Progress Notes (Signed)
 Progress Note   [name] FMW:[id number] DOB: Oct 05, 1975 DOA: 10/30/2023     3 DOS: the patient was seen and examined on 11/03/2023   Brief hospital course: Jonathon Page is a 48 y.o. male with medical history significant of refractory HTN, IDDM, HLD, amphetamine abuse, presented with chest pain shortness of breath.  Upon arrival in the hospital, she had a blood pressure of 118/140, heart rate 115, no hypoxia. Creatinine 1.5, BNP 2220, troponin 57.  Patient is diagnosed with acute on chronic congestive heart failure, echocardiogram was performed.  Started on IV Lasix .   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 History of cocaine abuse. Elevated troponin secondary to demand ischemia. Patient came to the hospital with significant shortness of breath, elevated BNP, chest x-ray showed significant vascular congestion.  Condition consistent with acute on chronic congestive heart failure. 7/13 TTE LVEF 45 to 50%, global hypokinesis, moderate concentric hypertrophy, grade 3 diastolic dysfunction, moderate TR Most likely triggered by hypertensive emergency. He states that he is not currently using any drugs, he was taking Adderall, which is probably the cause of positive amphetamine. Patient had a CT angiogram, did not show any renal artery stenosis. Patient still had significant shortness of breath, treated with IV Lasix .  Starting GDMT; added Aldactone ,  Farxiga ,Coreg ; could not start Entresto  due to history of anaphylactic reaction to ACE inhibitor. Patient is also started on hydralazine  and Imdur .  Blood pressure still running high, hydralazine  will be increased to 100 mg every 8 hours yesterday.  Pressure still running high, increase Coreg  to 25 mg twice a day Volume status much improved today, discontinued IV Lasix .  Recheck BMP tomorrow. - Amlodipine  10 mg p.o. every evening started on 7/16 -Imdur  60 mg p.o. daily increased dose from 7/70   Chest pain. CT angiogram chest/pelvis/abdomen did not show any aortic dissection.  Troponin only minimally elevated which appears to be secondary to demand ischemia.  Patient does has significant dysphagia and odynophagia.  chest pain could be due to esophageal abnormality.  Barium esophagram showed esophageal stricture   Acute kidney injury Hyponatremia. Hypokalemia. Patient Creatinine was higher at admission, but was normal before.   On fluid restriction for hyponatremia. Gave 25 g albumin  for slightly  worsening renal function.   Check BMP tomorrow. Check bladder scan   Chronic constipation. TSH normal at time of admission.   Patient also has chronic constipation, s/p senna, lactulose  and enema given on 7/15 - MiraLAX  twice daily started 7/16 -Dulcolax 10 mg p.o. nightly started 7/16 - Dulcolax suppository as needed - Use enema as needed   Esophageal stricture.  Complaining of dysphagia  EGD done on 71/6:  Esophageal stricture seen in the proximal esophagus that was dilated to 12 mm with a balloon. The patient will need a repeat EGD with dilation in 4 weeks.   Continue soft diet as per GI and may resume Lovenox  on 7/70   Uncontrolled type 2 diabetes with hyperglycemia A1c 7.5, continue sliding  scale insulin .  Also added Farxiga .  Continue to follow.   Lung nodule. Incidental finding chest CT scan, we will follow-up with PCP for repeat CT scan in the future.    Severe protein calorie  malnutrition. Patient has been losing weight recently, he also complaining of significant dysphagia Barium esophagram: Esophageal narrowing suspicious for stricture at the level of C7/T1.  Patient appears severely malnourished with muscle atrophy.  Continue protein supplement.     Subjective:  No significant events overnight, patient denies any worsening of shortness of breath, no chest pain.  Still having difficulty swallowing but able to take pills.  Patient was awaiting for EGD in the morning.  Physical Exam: Vitals:   11/03/23 1452 11/03/23 1502 11/03/23 1512 11/03/23 1608  BP: (!) 140/81 136/88 (!) 170/95 (!) 157/88  Pulse: 64 60 66 70  Resp: 13 10 14    Temp: (!) 96.5 F (35.8 C)   97.6 F (36.4 C)  TempSrc: Temporal     SpO2: 99% 96% 99% 99%  Weight:      Height:       General exam: Appears calm and comfortable  Respiratory system: Clear to auscultation. Respiratory effort normal. Cardiovascular system: S1 & S2 heard, RRR. No JVD, murmurs, rubs, gallops or clicks. No pedal edema. Gastrointestinal system: Abdomen is nondistended, soft and nontender. No organomegaly or masses felt. Normal bowel sounds heard. Central nervous system: Alert and oriented. No focal neurological deficits. Extremities: Symmetric 5 x 5 power. Skin: No rashes, lesions or ulcers Psychiatry: Judgement and insight appear normal. Mood & affect appropriate.    Data Reviewed:  Lab results reviewed.  Family Communication: None  Disposition: Status is: Inpatient Remains inpatient appropriate because: Severity of disease, inpatient procedure     Time spent: 55 minutes  Author: Elvan Sor, MD 11/03/2023 4:55 PM  For on call review [email or web address].
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
## 2023-11-03 NOTE — Progress Notes (Signed)
 Heart Failure Stewardship Pharmacy Note  PCP: [facility] PCP-Cardiologist: None  HPI: Jonathon Page is a 48 y.o. male with HTN, DM, HLD, amphetamine use who presented with chest pain and shortness of breath.On admission, BNP was 2220.6, HS-troponin was 57, PCT was <0.1 and UDS positive for amphetamines. Chest x-ray noted pulmonary vascular congestion without overt pulmonary edema. Repeat chest x-ray noted mild pulmonary vascular congestion and new right pleural effusion. Echocardiogram showed LVEF of 45-50% with moderate concentric hypertrophy, grade III diastolic dysfunction, low normal RV function, moderate TR. Read recommended consideration for infiltrative disease. Barium swallow showed esophageal stricture. GI consulted for evaluation.  Pertinent Lab Values: Creatinine, Ser  Date Value Ref Range Status  11/03/2023 1.65 (H) 0.61 - 1.24 mg/dL Final   BUN  Date Value Ref Range Status  11/03/2023 42 (H) 6 - 20 mg/dL Final   Potassium  Date Value Ref Range Status  11/03/2023 3.9 3.5 - 5.1 mmol/L Final   Sodium  Date Value Ref Range Status  11/03/2023 136 135 - 145 mmol/L Final   B Natriuretic Peptide  Date Value Ref Range Status  10/30/2023 2,220.6 (H) 0.0 - 100.0 pg/mL Final    Comment:    Performed at [facility], [address] [place], [place] [address]   Magnesium   Date Value Ref Range Status  11/03/2023 2.5 (H) 1.7 - 2.4 mg/dL Final    Comment:    Performed at [facility], [address] [place], [place] [address]   Hgb A1c MFr Bld  Date Value Ref Range Status  11/01/2023 7.5 (H) 4.8 - 5.6 % Final    Comment:    (NOTE)         Prediabetes: 5.7 - 6.4         Diabetes: >6.4         Glycemic control for adults with diabetes: <7.0    TSH  Date Value Ref Range Status  10/30/2023 0.842 0.350 - 4.500 uIU/mL Final    Comment:    Performed by a 3rd Generation assay with a functional sensitivity of <=0.01 uIU/mL. Performed at  [facility], [address] [place], [place] [address]     Vital Signs:  Temp:  [97.5 F (36.4 C)-98.9 F (37.2 C)] 98 F (36.7 C) (07/16 0743) Pulse Rate:  [79-89] 84 (07/16 0743) Cardiac Rhythm: Normal sinus rhythm (07/15 1901) Resp:  [17-18] 18 (07/16 0345) BP: (134-180)/(61-113) 180/113 (07/16 0743) SpO2:  [94 %-99 %] 96 % (07/16 0743) Weight:  [81.4 kg (179 lb 7.3 oz)] 81.4 kg (179 lb 7.3 oz) (07/16 0500)  Intake/Output Summary (Last 24 hours) at 11/03/2023 1017 Last data filed at 11/02/2023 1900 Gross per 24 hour  Intake 240 ml  Output --  Net 240 ml   Current Heart Failure Medications:  Loop diuretic: furosemide  40 mg IV BID Beta-Blocker: carvedilol  12.5 mg BID ACEI/ARB/ARNI: none MRA: spironolactone  12.5 mg daily SGLT2i: Farxiga  10 mg daily Other: none  Prior to admission Heart Failure Medications:  Loop diuretic: none Beta-Blocker: none ACEI/ARB/ARNI: none MRA: none SGLT2i: none Other: hydrochlorothiazide  25 mg daily, clonidine  0.2 mg patch  Assessment: 1. 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 NYHA class II-III symptoms.  -Symptoms: Denies shortness of breath at rest. Reports difficulty swallowing. Reports abdominal discomfort due to lack of BM in >1 week. -Volume: Appears to be euvolemic. No LEE. No JVP elevation. Creatinine up this AM  and BUN stabilized after holding furosemide . -Hemodynamics: BP is severely elevated. Hydrochlorothiazide  and clonidine  being held.  -BB: Continue carvedilol  25 mg BID. -ACEI/ARB/ARNI: Patient has a documented history of anaphylaxis to ACEi, however, after speaking with the patient this was unable to be confirmed. The patient actively takes lisinopril  daily at home without issue. There is no need to avoid Entresto  given the patient has never had angioedema. Would benefit from starting Entresto , however, if concerned, can consider  ARB. -MRA: Spironolactone  12.5 mg daily started. Can increase dose tomorrow if creatinine trends down. -SGLT2i: Farxiga  10 mg daily started.  Plan: 1) Medication changes recommended at this time: - Consider starting losartan  50 mg daily - Consider increasing spironolactone  to 25 mg daily  2) Patient assistance: -Copay for Entresto , Farxiga  and Jardiance are $0  3) Education: - Patient has been educated on current HF medications and potential additions to HF medication regimen - Patient verbalizes understanding that over the next few months, these medication doses may change and more medications may be added to optimize HF regimen - Patient has been educated on basic disease state pathophysiology and goals of therapy  Medication Assistance / Insurance Benefits Check: Does the patient have prescription insurance?    Type of insurance plan:  Does the patient qualify for medication assistance through manufacturers or grants? No   Outpatient Pharmacy: Prior to admission outpatient pharmacy: [facility]      Please do not hesitate to reach out with questions or concerns,  Jaun Bash, PharmD, CPP, BCPS, [facility] Heart Failure Pharmacist  Phone - [phone] 11/03/2023 10:17 AM

## 2023-11-03 NOTE — Anesthesia Procedure Notes (Signed)
 Procedure Name: MAC Date/Time: 11/03/2023 2:39 PM  Performed by: Lacretia Camelia NOVAK, CRNAPre-anesthesia Checklist: Emergency Drugs available, Patient identified, Suction available and Patient being monitored Patient Re-evaluated:Patient Re-evaluated prior to induction Oxygen Delivery Method: Nasal cannula

## 2023-11-03 NOTE — Discharge Instructions (Signed)
 Jonathon Page

## 2023-11-04 ENCOUNTER — Other Ambulatory Visit: Payer: Self-pay

## 2023-11-04 DIAGNOSIS — I5033 Acute on chronic diastolic (congestive) heart failure: Secondary | ICD-10-CM | POA: Diagnosis not present

## 2023-11-04 LAB — BASIC METABOLIC PANEL WITH GFR
Anion gap: 13 (ref 5–15)
BUN: 36 mg/dL — ABNORMAL HIGH (ref 6–20)
CO2: 27 mmol/L (ref 22–32)
Calcium: 9.6 mg/dL (ref 8.9–10.3)
Chloride: 96 mmol/L — ABNORMAL LOW (ref 98–111)
Creatinine, Ser: 1.61 mg/dL — ABNORMAL HIGH (ref 0.61–1.24)
GFR, Estimated: 53 mL/min — ABNORMAL LOW (ref >60)
Glucose, Bld: 171 mg/dL — ABNORMAL HIGH (ref 70–99)
Potassium: 4.5 mmol/L (ref 3.5–5.1)
Sodium: 136 mmol/L (ref 135–145)

## 2023-11-04 LAB — PHOSPHORUS: Phosphorus: 3.8 mg/dL (ref 2.5–4.6)

## 2023-11-04 LAB — CBC
HCT: 43.2 % (ref 39.0–52.0)
Hemoglobin: 14.2 g/dL (ref 13.0–17.0)
MCH: 29 pg (ref 26.0–34.0)
MCHC: 32.9 g/dL (ref 30.0–36.0)
MCV: 88.3 fL (ref 80.0–100.0)
Platelets: 312 K/uL (ref 150–400)
RBC: 4.89 MIL/uL (ref 4.22–5.81)
RDW: 14.6 % (ref 11.5–15.5)
WBC: 10.3 K/uL (ref 4.0–10.5)
nRBC: 0 % (ref 0.0–0.2)

## 2023-11-04 LAB — MAGNESIUM: Magnesium: 2.5 mg/dL — ABNORMAL HIGH (ref 1.7–2.4)

## 2023-11-04 LAB — GLUCOSE, CAPILLARY
Glucose-Capillary: 164 mg/dL — ABNORMAL HIGH (ref 70–99)
Glucose-Capillary: 232 mg/dL — ABNORMAL HIGH (ref 70–99)

## 2023-11-04 MED ORDER — SACUBITRIL-VALSARTAN 24-26 MG PO TABS: 1.0000 | ORAL_TABLET | Freq: Two times a day (BID) | ORAL | 11 refills | Status: AC

## 2023-11-04 MED ORDER — HYDRALAZINE HCL 100 MG PO TABS: 100.0000 mg | ORAL_TABLET | Freq: Three times a day (TID) | ORAL | 11 refills | Status: AC | PRN

## 2023-11-04 MED ORDER — BISACODYL 5 MG PO TBEC
10.0000 mg | DELAYED_RELEASE_TABLET | Freq: Every day | ORAL | 0 refills | Status: DC
  Filled 2023-11-04: qty 60, 30d supply, fill #0

## 2023-11-04 MED ORDER — BISACODYL 10 MG RE SUPP
10.0000 mg | Freq: Every day | RECTAL | 0 refills | Status: DC | PRN
  Filled 2023-11-04: qty 12, 12d supply, fill #0

## 2023-11-04 MED ORDER — LANTUS SOLOSTAR 100 UNIT/ML ~~LOC~~ SOPN: 15.0000 [IU] | PEN_INJECTOR | Freq: Two times a day (BID) | SUBCUTANEOUS | 1 refills | Status: AC

## 2023-11-04 MED ORDER — BISACODYL 10 MG RE SUPP: 10.0000 mg | Freq: Every day | RECTAL | 0 refills | Status: AC | PRN

## 2023-11-04 MED ORDER — CARVEDILOL 25 MG PO TABS: 25.0000 mg | ORAL_TABLET | Freq: Two times a day (BID) | ORAL | 11 refills | Status: AC

## 2023-11-04 MED ORDER — SPIRONOLACTONE 25 MG PO TABS
25.0000 mg | ORAL_TABLET | Freq: Every day | ORAL | 11 refills | Status: AC
Start: 2023-11-05 — End: ?

## 2023-11-04 MED ORDER — DAPAGLIFLOZIN PROPANEDIOL 10 MG PO TABS
10.0000 mg | ORAL_TABLET | Freq: Every day | ORAL | 11 refills | Status: DC
  Filled 2023-11-04: qty 30, 30d supply, fill #0

## 2023-11-04 MED ORDER — SPIRONOLACTONE 25 MG PO TABS
25.0000 mg | ORAL_TABLET | Freq: Every day | ORAL | 11 refills | Status: DC
  Filled 2023-11-04: qty 30, 30d supply, fill #0

## 2023-11-04 MED ORDER — ISOSORBIDE MONONITRATE ER 60 MG PO TB24: 60.0000 mg | ORAL_TABLET | Freq: Every day | ORAL | 11 refills | Status: AC

## 2023-11-04 MED ORDER — DAPAGLIFLOZIN PROPANEDIOL 10 MG PO TABS: 10.0000 mg | ORAL_TABLET | Freq: Every day | ORAL | 11 refills | Status: AC

## 2023-11-04 MED ORDER — HYDRALAZINE HCL 100 MG PO TABS
100.0000 mg | ORAL_TABLET | Freq: Three times a day (TID) | ORAL | 11 refills | Status: DC | PRN
  Filled 2023-11-04: qty 100, 34d supply, fill #0

## 2023-11-04 MED ORDER — CARVEDILOL 25 MG PO TABS
25.0000 mg | ORAL_TABLET | Freq: Two times a day (BID) | ORAL | 11 refills | Status: DC
  Filled 2023-11-04: qty 60, 30d supply, fill #0

## 2023-11-04 MED ORDER — POLYETHYLENE GLYCOL 3350 17 GM/SCOOP PO POWD
17.0000 g | Freq: Two times a day (BID) | ORAL | 2 refills | Status: DC
  Filled 2023-11-04: qty 510, 15d supply, fill #0

## 2023-11-04 MED ORDER — AMLODIPINE BESYLATE 10 MG PO TABS
10.0000 mg | ORAL_TABLET | Freq: Every evening | ORAL | 11 refills | Status: DC
  Filled 2023-11-04: qty 30, 30d supply, fill #0

## 2023-11-04 MED ORDER — ISOSORBIDE MONONITRATE ER 60 MG PO TB24
60.0000 mg | ORAL_TABLET | Freq: Every day | ORAL | 11 refills | Status: DC
  Filled 2023-11-04: qty 30, 30d supply, fill #0

## 2023-11-04 MED ORDER — SACUBITRIL-VALSARTAN 24-26 MG PO TABS
1.0000 | ORAL_TABLET | Freq: Two times a day (BID) | ORAL | 11 refills | Status: DC
Start: 2023-11-04 — End: 2023-11-04
  Filled 2023-11-04: qty 60, 30d supply, fill #0

## 2023-11-04 MED ORDER — AMLODIPINE BESYLATE 10 MG PO TABS: 10.0000 mg | ORAL_TABLET | Freq: Every evening | ORAL | 11 refills | Status: AC

## 2023-11-04 MED ORDER — BISACODYL 5 MG PO TBEC: 10.0000 mg | DELAYED_RELEASE_TABLET | Freq: Every day | ORAL | 0 refills | Status: AC

## 2023-11-04 MED ORDER — INSULIN LISPRO (1 UNIT DIAL) 100 UNIT/ML (KWIKPEN): 10.0000 [IU] | PEN_INJECTOR | Freq: Three times a day (TID) | SUBCUTANEOUS | 1 refills | Status: AC

## 2023-11-04 MED ORDER — SPIRONOLACTONE 25 MG PO TABS
25.0000 mg | ORAL_TABLET | Freq: Every day | ORAL | Status: DC
  Administered 2023-11-04: 25 mg via ORAL
  Filled 2023-11-04: qty 1

## 2023-11-04 MED ORDER — POLYETHYLENE GLYCOL 3350 17 GM/SCOOP PO POWD: 17.0000 g | Freq: Two times a day (BID) | ORAL | 2 refills | Status: AC

## 2023-11-04 NOTE — Plan of Care (Signed)
  Problem: Clinical Measurements: Goal: Ability to maintain clinical measurements within normal limits will improve Outcome: Progressing   Problem: Clinical Measurements: Goal: Respiratory complications will improve Outcome: Progressing   Problem: Clinical Measurements: Goal: Cardiovascular complication will be avoided Outcome: Progressing   Problem: Pain Managment: Goal: General experience of comfort will improve and/or be controlled Outcome: Progressing   Problem: Safety: Goal: Ability to remain free from injury will improve Outcome: Progressing

## 2023-11-04 NOTE — TOC Transition Note (Signed)
 Transition of Care [facility]) - Discharge Note   Patient Details  Name: Jonathon Page MRN: [id number] Date of Birth: April 28, 1975  [facility] CM/SW Contact:  Tomasa JAYSON Childes, RN Phone Number: 11/04/2023, 12:36 PM   Clinical Narrative:    Spoke with patient about substance abuse resources. Patient declined to receive resources. Patient's girlfriend will transport him home.            Patient Goals and CMS Choice            Discharge Placement                       Discharge Plan and Services Additional resources added to the After Visit Summary for                                       Social Drivers of Health (SDOH) Interventions SDOH Screenings   Food Insecurity: No Food Insecurity (11/01/2023)  Housing: Unknown (11/01/2023)  Transportation Needs: No Transportation Needs (11/01/2023)  Utilities: Patient Declined (10/31/2023)  Financial Resource Strain: Medium Risk (11/01/2023)  Tobacco Use: High Risk (11/01/2023)     Readmission Risk Interventions     No data to display

## 2023-11-04 NOTE — Progress Notes (Signed)
 Heart Failure Stewardship Pharmacy Note  PCP: [facility] PCP-Cardiologist: None  HPI: Jonathon Page is a 48 y.o. male with HTN, DM, HLD, amphetamine use who presented with chest pain and shortness of breath.On admission, BNP was 2220.6, HS-troponin was 57, PCT was <0.1 and UDS positive for amphetamines. Chest x-ray noted pulmonary vascular congestion without overt pulmonary edema. Repeat chest x-ray noted mild pulmonary vascular congestion and new right pleural effusion. Echocardiogram showed LVEF of 45-50% with moderate concentric hypertrophy, grade III diastolic dysfunction, low normal RV function, moderate TR. Read recommended consideration for infiltrative disease. Barium swallow showed esophageal stricture. GI consulted for evaluation. EGD performed 11/03/23 with dilation of esophageal stricture - plans to repeat in 4 weeks.  Pertinent Lab Values: Creatinine, Ser  Date Value Ref Range Status  11/04/2023 1.61 (H) 0.61 - 1.24 mg/dL Final   BUN  Date Value Ref Range Status  11/04/2023 36 (H) 6 - 20 mg/dL Final   Potassium  Date Value Ref Range Status  11/04/2023 4.5 3.5 - 5.1 mmol/L Final   Sodium  Date Value Ref Range Status  11/04/2023 136 135 - 145 mmol/L Final   B Natriuretic Peptide  Date Value Ref Range Status  10/30/2023 2,220.6 (H) 0.0 - 100.0 pg/mL Final    Comment:    Performed at [facility], [address] [place], [place] [address]   Magnesium   Date Value Ref Range Status  11/04/2023 2.5 (H) 1.7 - 2.4 mg/dL Final    Comment:    Performed at [facility], [address] [place], [place] [address]   Hgb A1c MFr Bld  Date Value Ref Range Status  11/01/2023 7.5 (H) 4.8 - 5.6 % Final    Comment:    (NOTE)         Prediabetes: 5.7 - 6.4         Diabetes: >6.4         Glycemic control for adults with diabetes: <7.0    TSH  Date Value Ref Range Status  10/30/2023 0.842 0.350 - 4.500 uIU/mL Final    Comment:    Performed by a  3rd Generation assay with a functional sensitivity of <=0.01 uIU/mL. Performed at [facility], [address] [place], [place] [address]     Vital Signs:  Temp:  [96.5 F (35.8 C)-98 F (36.7 C)] 97.8 F (36.6 C) (07/17 1132) Pulse Rate:  [60-88] 77 (07/17 1132) Cardiac Rhythm: Normal sinus rhythm (07/16 2034) Resp:  [10-18] 18 (07/17 0933) BP: (136-190)/(76-108) 154/97 (07/17 1132) SpO2:  [96 %-99 %] 97 % (07/17 1132) Weight:  [82.2 kg (181 lb 3.2 oz)] 82.2 kg (181 lb 3.2 oz) (07/17 0500)  Intake/Output Summary (Last 24 hours) at 11/04/2023 1150 Last data filed at 11/04/2023 0900 Gross per 24 hour  Intake 256.32 ml  Output --  Net 256.32 ml   Current Heart Failure Medications:  Loop diuretic: none Beta-Blocker: carvedilol  12.5 mg BID ACEI/ARB/ARNI: none MRA: spironolactone  12.5 mg daily SGLT2i: Farxiga  10 mg daily Other: amlodipine  10 mg daily, hydralazine  100 mg q8h  Prior to admission Heart Failure Medications:  Loop diuretic: none Beta-Blocker: none ACEI/ARB/ARNI: none MRA: none SGLT2i: none Other: hydrochlorothiazide  25 mg daily, clonidine  0.2 mg patch  Assessment: 1. 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 NYHA class II-III symptoms.  -Symptoms: Denies shortness of breath at rest. Reports difficulty swallowing improved post procedure. Did experience one episode  of vomiting this AM that patient attributes to acidic coffee irritating his esophagus.  -Volume: Appears to be euvolemic. No LEE. No JVP elevation. Creatinine slightly down AM and BUN stabilized after holding furosemide . Suspect patient will not need scheduled diuresis at discharge, but may benefit from prn furosemide . -Hemodynamics: BP is severely elevated. Recheck late morning improved. -BB: Continue carvedilol  25 mg BID. Can consider titration to 50 mg BID in the future. -ACEI/ARB/ARNI: Patient has a  documented history of anaphylaxis to ACEi, however, after speaking with the patient this was unable to be confirmed. The patient actively takes lisinopril  daily at home without issue. There is no need to avoid Entresto  given the patient has never had angioedema. Would benefit from starting Entresto , however, if concerned, can consider ARB first. -MRA: Spironolactone  12.5 mg daily started. Can increase dose today given creatinine is beginning to trend down. -SGLT2i: Farxiga  10 mg daily.  Plan: 1) Medication changes recommended at this time: - Consider Entresto  49-51 mg BID vs losartan  50 mg daily pending provider preference. - Consider increasing spironolactone  to 25 mg daily.    2) Patient assistance: -Copay for Entresto , Farxiga  and Jardiance are $0  3) Education: - Patient has been educated on current HF medications and potential additions to HF medication regimen - Patient verbalizes understanding that over the next few months, these medication doses may change and more medications may be added to optimize HF regimen - Patient has been educated on basic disease state pathophysiology and goals of therapy  Medication Assistance / Insurance Benefits Check: Does the patient have prescription insurance?    Type of insurance plan:  Does the patient qualify for medication assistance through manufacturers or grants? No   Outpatient Pharmacy: Prior to admission outpatient pharmacy: [facility]      Please do not hesitate to reach out with questions or concerns,  Jaun Bash, PharmD, CPP, BCPS, [facility] Heart Failure Pharmacist  Phone - [phone] 11/04/2023 11:50 AM

## 2023-11-04 NOTE — Discharge Summary (Signed)
 [facility] Discharge Summary   [name] FMW:[id number]  PCP: [facility]  Date of admission: 10/30/2023   Date of discharge:  11/04/2023     Discharge Diagnoses:  Principal Problem:   CHF (congestive heart failure) (HCC) Active Problems:   Hypertensive emergency   Protein-calorie malnutrition, severe (HCC)   Acute on chronic diastolic CHF (congestive heart failure) (HCC)   Amphetamine abuse (HCC)   Lung nodule   Uncontrolled type 2 diabetes mellitus with hyperglycemia, without long-term current use of insulin  (HCC)   Dysphagia   CHF, acute on chronic (HCC)   Esophageal stricture   Stricture and stenosis of esophagus   Admitted From: Home Disposition:  Home   Recommendations for Outpatient Follow-up:  Follow-up with PCP in 1 week, continue to monitor BP at home and follow with PCP to titrate medication accordingly. Follow-up with cardiology for CHF management as an outpatient in 1 to 2 weeks Follow-up with GI for repeat EGD in 4 weeks Repeat BMP after 1 week Follow up LABS/TEST: BMP in 1 week.  CT chest in 4 to 6 weeks to follow-up on lung nodule, may need referral to pulmonary as an outpatient   Follow-up Information     [facility]. Go on 11/08/2023.   Specialty: Cardiology Why: Hospital Follow-Up 11/08/23 @ 9:30 AM Please bring all medications to follow-up appointment [facility], [address] Floor Free Valet Services at the door Contact information: [address] [place]  [address] [phone]        [facility] Follow up in 1 week(s).   Contact information: [address] [place] [address] [phone]         Therisa Bi, MD Follow up in 1 month(s).   Specialty: Gastroenterology Contact information: [address] [place] [address] [phone]                Diet recommendation: Cardiac  and Carb modified diet  Activity: The patient is advised to gradually reintroduce usual activities, as tolerated  Discharge Condition: stable  Code Status: Full code   History of present illness: As per the H and P dictated on admission. Hospital Course:  Jonathon Page is a 48 y.o. male with medical history significant of refractory HTN, IDDM, HLD, amphetamine abuse, presented with chest pain shortness of breath.  Upon arrival in the hospital, she had a blood pressure of 118/140, heart rate 115, no hypoxia. Creatinine 1.5, BNP 2220, troponin 57.  Patient is diagnosed with acute on chronic congestive heart failure, echocardiogram was performed.  Started on IV Lasix .  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  # Hypertension emergency.  # History of cocaine abuse. # Elevated troponin secondary to demand ischemia. Patient came to the hospital with significant shortness of breath, elevated BNP, chest x-ray showed significant vascular congestion.  Condition consistent with acute on chronic congestive heart failure. 7/13 TTE LVEF 45 to 50%, global hypokinesis, moderate concentric hypertrophy, grade 3 diastolic dysfunction, moderate TR Most likely triggered by hypertensive emergency. He states that he is not currently using any drugs, he was taking Adderall, which is probably the cause of positive amphetamine. Patient had a CT angiogram, did not show any renal artery stenosis. Patient still had significant shortness of breath, treated with IV Lasix .  Starting GDMT; added Aldactone , Farxiga ,Coreg ; could not start Entresto  due to history of anaphylactic  reaction to ACE inhibitor. Patient is also started on hydralazine  and Imdur .  Blood pressure still running high, hydralazine  will be increased to 100 mg every 8 hours yesterday.  Pressure still running high, increase Coreg  to 25 mg twice a day Volume status much improved, discontinued IV Lasix .   -  Amlodipine  10 mg p.o. every evening started on 7/16 -Imdur  60 mg p.o. daily increased dose from 7/70 7/17 blood pressure was high in the morning, improved after morning meds.  Patient was discharged on Amlodipine  10 mg p.o. every evening, Coreg  25 mg p.o. twice daily, Imdur  60 mg p.o. daily, Entresto  24-26 mg p.o. twice daily, Aldactone  25 mg p.o. daily, Farxiga  10 mg p.o. daily, hydralazine  100 mg p.o. TID prn if SBP >140 mmHg.  Patient was advised to monitor BP and follow with PCP to titrate medications accordingly.    # Chest pain, possible secondary to esophageal stricture CT angiogram chest/pelvis/abdomen did not show any aortic dissection.  Troponin only minimally elevated which appears to be secondary to demand ischemia.  Patient does has significant dysphagia and odynophagia.  chest pain could be due to esophageal abnormality.  Barium esophagram showed esophageal stricture   # Acute kidney injury.  Resolved # Hyponatremia.  Resolved # Hypokalemia.  Resolved Patient Creatinine was higher at admission, but was normal before.   On fluid restriction for hyponatremia. Gave 25 g albumin  for slightly  worsening renal function.  Follow with PCP and repeat BMP after 1 week.   # Chronic constipation. TSH normal at time of admission.   Patient also has chronic constipation, s/p senna, lactulose  and enema given on 7/15 - MiraLAX  twice daily started 7/16 -Dulcolax 10 mg p.o. nightly started 7/16 - Dulcolax suppository as needed - Use enema as needed   # Esophageal stricture.  Complaining of dysphagia  EGD done on 71/6:  Esophageal stricture seen in the proximal esophagus that was dilated to 12 mm with a balloon. The patient will need a repeat EGD with dilation in 4 weeks.   Continue soft diet as per GI and may resume Lovenox  on 7/70    # Uncontrolled type 2 diabetes with hyperglycemia A1c 7.5, s/p sliding scale insulin .  Also added Farxiga .   Resumed Lantus  and sliding scale home dose on  discharge.  Advised to monitor CBG and continue diabetic diet.  Follow with PCP.   Lung nodule. Incidental finding chest CT scan, we will follow-up with PCP for repeat CT scan in the future.    Body mass index is 23.91 kg/m.  Nutrition Interventions:  - Patient was instructed, not to drive, operate heavy machinery, perform activities at heights, swimming or participation in water activities or provide baby sitting services while on Pain, Sleep and Anxiety Medications; until his outpatient Physician has advised to do so again.  - Also recommended to not to take more than prescribed Pain, Sleep and Anxiety Medications.  Patient was ambulatory without any assistance. On the day of the discharge the patient's vitals were stable, and no other acute medical condition were reported by patient. the patient was felt safe to be discharge at Home.  Consultants: GI Procedures: s/p EGD and esophageal dilatation   Discharge Exam: General: Appear in no distress, no Rash; Oral Mucosa Clear, moist. Cardiovascular: S1 and S2 Present, no Murmur, Respiratory: normal respiratory effort, Bilateral Air entry present and no Crackles, no wheezes Abdomen: Bowel Sound present, Soft and no tenderness, no hernia Extremities: no Pedal edema, no calf tenderness Neurology: alert and  oriented to time, place, and person affect appropriate.  Filed Weights   11/02/23 0500 11/03/23 0500 11/04/23 0500  Weight: 82.4 kg 81.4 kg 82.2 kg   Vitals:   11/04/23 0933 11/04/23 1132  BP:  (!) 154/97  Pulse:  77  Resp: 18   Temp:  97.8 F (36.6 C)  SpO2:  97%    DISCHARGE MEDICATION: Allergies as of 11/04/2023       Reactions   Ace Inhibitors Anaphylaxis, Swelling   Pt does not recall this event. He reports taking lisinopril  daily at home off and on and has had no issues. Suspect this was not anaphylaxis. Patient denies any angioedema and reports he did not require intubation.        Medication List     STOP  taking these medications    amlodipine -atorvastatin 10-10 MG tablet Commonly known as: CADUET   cloNIDine  0.2 mg/24hr patch Commonly known as: CATAPRES  - Dosed in mg/24 hr   gabapentin  300 MG capsule Commonly known as: NEURONTIN    hydrochlorothiazide  25 MG tablet Commonly known as: HYDRODIURIL    lisinopril -hydrochlorothiazide  20-12.5 MG tablet Commonly known as: ZESTORETIC    meloxicam 15 MG tablet Commonly known as: MOBIC   metoprolol  200 MG 24 hr tablet Commonly known as: TOPROL -XL   metoprolol  succinate 100 MG 24 hr tablet Commonly known as: TOPROL -XL   promethazine  25 MG tablet Commonly known as: PHENERGAN        TAKE these medications    amLODipine  10 MG tablet Commonly known as: NORVASC  Take 1 tablet (10 mg total) by mouth every evening.   baclofen 20 MG tablet Commonly known as: LIORESAL Take 20 mg by mouth 3 (three) times daily.   bisacodyl  10 MG suppository Commonly known as: DULCOLAX Place 1 suppository (10 mg total) rectally daily as needed for severe constipation.   bisacodyl  5 MG EC tablet Commonly known as: DULCOLAX Take 2 tablets (10 mg total) by mouth at bedtime.   blood glucose meter kit and supplies Dispense based on patient and insurance preference. Use up to four times daily as directed. (FOR ICD-10 E10.9, E11.9).   carvedilol  25 MG tablet Commonly known as: COREG  Take 1 tablet (25 mg total) by mouth 2 (two) times daily with a meal.   clonazePAM  0.5 MG tablet Commonly known as: KLONOPIN  Take 0.25 mg by mouth 3 (three) times daily. What changed: Another medication with the same name was removed. Continue taking this medication, and follow the directions you see here.   dapagliflozin  propanediol 10 MG Tabs tablet Commonly known as: FARXIGA  Take 1 tablet (10 mg total) by mouth daily. Start taking on: November 05, 2023   famotidine  20 MG tablet Commonly known as: PEPCID  Take 20 mg by mouth every 12 (twelve) hours as needed for heartburn  or indigestion.   fluticasone 50 MCG/ACT nasal spray Commonly known as: FLONASE Place 2 sprays into both nostrils daily.   FreeStyle Libre 2 Sensor Misc 1 each by Does not apply route 4 (four) times daily -  before meals and at bedtime.   hydrALAZINE  100 MG tablet Commonly known as: APRESOLINE  Take 1 tablet (100 mg total) by mouth 3 (three) times daily as needed (Systolic BP greater than 140 mmHg).   insulin  lispro 100 UNIT/ML KwikPen Commonly known as: HumaLOG  KwikPen Inject 10 Units into the skin 3 (three) times daily.   Insulin  Pen Needle 32G X 4 MM Misc 1 each by Does not apply route 4 (four) times daily -  before meals and at  bedtime.   isosorbide  mononitrate 60 MG 24 hr tablet Commonly known as: IMDUR  Take 1 tablet (60 mg total) by mouth daily. Start taking on: November 05, 2023   Lantus  SoloStar 100 UNIT/ML Solostar Pen Generic drug: insulin  glargine Inject 15 Units into the skin 2 (two) times daily.   omeprazole 40 MG capsule Commonly known as: PRILOSEC Take 40 mg by mouth daily.   oxyCODONE -acetaminophen  7.5-325 MG tablet Commonly known as: PERCOCET Take 1 tablet by mouth 4 (four) times daily as needed. What changed: Another medication with the same name was removed. Continue taking this medication, and follow the directions you see here.   polyethylene glycol powder 17 GM/SCOOP powder Commonly known as: GLYCOLAX /MIRALAX  Take 17 g by mouth 2 (two) times daily.   sacubitril -valsartan  24-26 MG Commonly known as: Entresto  Take 1 tablet by mouth 2 (two) times daily.   sildenafil 20 MG tablet Commonly known as: REVATIO Take 20 mg by mouth daily as needed (30 minutes prior to sexual activity).   spironolactone  25 MG tablet Commonly known as: ALDACTONE  Take 1 tablet (25 mg total) by mouth daily. Start taking on: November 05, 2023       Allergies  Allergen Reactions   Ace Inhibitors Anaphylaxis and Swelling    Pt does not recall this event. He reports taking  lisinopril  daily at home off and on and has had no issues. Suspect this was not anaphylaxis. Patient denies any angioedema and reports he did not require intubation.   Discharge Instructions     Call MD for:   Complete by: As directed    Uncontrolled blood pressure, persistent constipation and dysphagia.   Call MD for:  difficulty breathing, headache or visual disturbances   Complete by: As directed    Call MD for:  extreme fatigue   Complete by: As directed    Call MD for:  persistant dizziness or light-headedness   Complete by: As directed    Call MD for:  persistant nausea and vomiting   Complete by: As directed    Call MD for:  severe uncontrolled pain   Complete by: As directed    Call MD for:  temperature >100.4   Complete by: As directed    Diet - low sodium heart healthy   Complete by: As directed    Discharge instructions   Complete by: As directed    Follow-up with PCP in 1 week, continue to monitor BP at home and follow with PCP to titrate medication accordingly. Follow-up with cardiology for CHF management as an outpatient in 1 to 2 weeks Follow-up with GI for repeat EGD in 4 weeks Repeat BMP after 1 week   Increase activity slowly   Complete by: As directed        The results of significant diagnostics from this hospitalization (including imaging, microbiology, ancillary and laboratory) are listed below for reference.    Significant Diagnostic Studies: DG ESOPHAGUS W DOUBLE CM (HD) Result Date: 11/01/2023 CLINICAL DATA:  [id number] Dysphagia 3056 48 year old male with a history of dysphagia for the past year. States that solid foods get stuck in the back of his throat causing him to get choked and occasionally regurgitate food. Denies any difficulty swallowing liquids. Admits to unintentional weight loss of 50-60 pounds over the past year which he attributes to his dysphagia. EXAM: ESOPHAGUS/BARIUM SWALLOW/TABLET STUDY TECHNIQUE: Combined double and single contrast  examination was performed using effervescent crystals, high-density barium, and thin liquid barium. This exam was performed by Sherrilee Bal  PA-C, and was supervised and interpreted by Dr. JINNY Hall. FLUOROSCOPY: Radiation Exposure Index and estimated peak skin dose (PSD); Reference air kerma (RAK), 23.8 mGy. Kerma-area product (KAP), 749.7 uGy*m. COMPARISON:  None Available. FINDINGS: Swallowing: Appears normal. No vestibular penetration or aspiration seen. Pharynx: Notable area of narrowing at the level of approximately C7/T1 visualized consistently throughout the study. Esophagus: Normal appearance. No obvious mucosal abnormality or visible ulceration. Esophageal motility: Within normal limits. Hiatal Hernia: None visualized Gastroesophageal reflux: None visualized with coughing or Valsalva. Ingested 13mm barium tablet: Tablet became stuck in the upper esophagus approximately at the level of C7/T1 corresponding to esophageal narrowing seen throughout the study. Tablet would not pass with additional water or thin barium intake. Other: None. IMPRESSION: Esophageal narrowing suspicious for stricture at the level of C7/T1. Electronically Signed   By: Thom Hall M.D.   On: 11/01/2023 12:29   ECHOCARDIOGRAM COMPLETE Result Date: 10/31/2023    ECHOCARDIOGRAM REPORT   Patient Name:   Jonathon Page Date of Exam: 10/31/2023 Medical Rec #:  [id number]     Height:       73.0 in Accession #:    [id number]    Weight:       181.0 lb Date of Birth:  1975-11-24     BSA:          2.062 m Patient Age:    47 years      BP:           155/96 mmHg Patient Gender: M             HR:           88 bpm. Exam Location:  [facility] Procedure: 2D Echo, Cardiac Doppler and Color Doppler (Both Spectral and Color            Flow Doppler were utilized during procedure). Indications:     CHF-Acute Diastolic I50.31  History:         Patient has no prior history of Echocardiogram examinations.                  Risk Factors:Hypertension.  Sonographer:      Bari Roar Referring Phys:  [id number] CORT ONEIDA MANA Diagnosing Phys: Cara JONETTA Lovelace MD IMPRESSIONS  1. Consider an infiltrate process ie.SABRA amyloid/sarcoid.  2. Left ventricular ejection fraction, by estimation, is 45 to 50%. The left ventricle has mildly decreased function. The left ventricle demonstrates global hypokinesis. There is moderate concentric left ventricular hypertrophy. Left ventricular diastolic parameters are consistent with Grade III diastolic dysfunction (restrictive).  3. Right ventricular systolic function is low normal. The right ventricular size is mildly enlarged. Mildly increased right ventricular wall thickness.  4. Left atrial size was mild to moderately dilated.  5. Right atrial size was mildly dilated.  6. The mitral valve is grossly normal. Trivial mitral valve regurgitation.  7. Tricuspid valve regurgitation is moderate.  8. The aortic valve is normal in structure. Aortic valve regurgitation is trivial. FINDINGS  Left Ventricle: Left ventricular ejection fraction, by estimation, is 45 to 50%. The left ventricle has mildly decreased function. The left ventricle demonstrates global hypokinesis. Strain was performed and the global longitudinal strain is indeterminate. The left ventricular internal cavity size was normal in size. There is moderate concentric left ventricular hypertrophy. Left ventricular diastolic parameters are consistent with Grade III diastolic dysfunction (restrictive). Right Ventricle: The right ventricular size is mildly enlarged. Mildly increased right ventricular wall thickness. Right ventricular systolic function is low normal.  Left Atrium: Left atrial size was mild to moderately dilated. Right Atrium: Right atrial size was mildly dilated. Pericardium: There is no evidence of pericardial effusion. Mitral Valve: The mitral valve is grossly normal. There is moderate thickening of the mitral valve leaflet(s). Mild mitral annular calcification. Trivial mitral  valve regurgitation. MV peak gradient, 3.4 mmHg. The mean mitral valve gradient is 1.0 mmHg. Tricuspid Valve: The tricuspid valve is grossly normal. Tricuspid valve regurgitation is moderate. Aortic Valve: The aortic valve is normal in structure. Aortic valve regurgitation is trivial. Aortic valve mean gradient measures 3.0 mmHg. Aortic valve peak gradient measures 5.2 mmHg. Aortic valve area, by VTI measures 4.67 cm. Pulmonic Valve: The pulmonic valve was grossly normal. Pulmonic valve regurgitation is not visualized. Aorta: The ascending aorta was not well visualized. IAS/Shunts: No atrial level shunt detected by color flow Doppler. Additional Comments: Consider an infiltrate process ie.SABRA amyloid/sarcoid. 3D was performed not requiring image post processing on an independent workstation and was indeterminate.  LEFT VENTRICLE PLAX 2D LVIDd:         4.80 cm      Diastology LVIDs:         3.70 cm      LV e' medial:    6.96 cm/s LV PW:         1.60 cm      LV E/e' medial:  10.3 LV IVS:        1.60 cm      LV e' lateral:   7.72 cm/s LVOT diam:     2.20 cm      LV E/e' lateral: 9.3 LV SV:         70 LV SV Index:   34 LVOT Area:     3.80 cm  LV Volumes (MOD) LV vol d, MOD A2C: 119.0 ml LV vol d, MOD A4C: 114.0 ml LV vol s, MOD A2C: 62.4 ml LV vol s, MOD A4C: 62.5 ml LV SV MOD A2C:     56.6 ml LV SV MOD A4C:     114.0 ml LV SV MOD BP:      55.2 ml RIGHT VENTRICLE RV Basal diam:  3.70 cm RV Mid diam:    2.90 cm RV S prime:     11.60 cm/s TAPSE (M-mode): 1.9 cm LEFT ATRIUM              Index        RIGHT ATRIUM           Index LA diam:        4.60 cm  2.23 cm/m   RA Area:     20.10 cm LA Vol (A2C):   101.0 ml 48.98 ml/m  RA Volume:   63.00 ml  30.55 ml/m LA Vol (A4C):   90.5 ml  43.88 ml/m LA Biplane Vol: 95.7 ml  46.41 ml/m  AORTIC VALVE                    PULMONIC VALVE AV Area (Vmax):    3.77 cm     PV Vmax:          0.96 m/s AV Area (Vmean):   3.92 cm     PV Peak grad:     3.7 mmHg AV Area (VTI):     4.67 cm      PR End Diast Vel: 5.20 msec AV Vmax:           114.00 cm/s  RVOT Peak grad:  3 mmHg AV Vmean:          75.300 cm/s AV VTI:            0.149 m AV Peak Grad:      5.2 mmHg AV Mean Grad:      3.0 mmHg LVOT Vmax:         113.00 cm/s LVOT Vmean:        77.600 cm/s LVOT VTI:          0.183 m LVOT/AV VTI ratio: 1.23  AORTA Ao Root diam: 3.40 cm Ao Asc diam:  3.90 cm MITRAL VALVE               TRICUSPID VALVE MV Area (PHT): 4.04 cm    TR Peak grad:   32.9 mmHg MV Area VTI:   3.62 cm    TR Vmax:        287.00 cm/s MV Peak grad:  3.4 mmHg MV Mean grad:  1.0 mmHg    SHUNTS MV Vmax:       0.93 m/s    Systemic VTI:  0.18 m MV Vmean:      53.3 cm/s   Systemic Diam: 2.20 cm MV Decel Time: 188 msec MV E velocity: 71.80 cm/s MV A velocity: 40.90 cm/s MV E/A ratio:  1.76 MV A Prime:    6.1 cm/s Cara JONETTA Lovelace MD Electronically signed by Cara JONETTA Lovelace MD Signature Date/Time: 10/31/2023/6:45:21 PM    Final    DG Chest 1 View Result Date: 10/31/2023 EXAM: 1 VIEW XRAY OF THE CHEST 10/31/2023 07:16:08 AM COMPARISON: 1 view chest x-ray 10/30/2023 and CTA chest 10/30/2023. CLINICAL HISTORY: CHF (congestive heart failure) (HCC) [id number]. Per ordering note: CHF. FINDINGS: LUNGS AND PLEURA: Mild pulmonary vascular congestion is again noted. A new right pleural effusion is present. The left lung is clear. HEART AND MEDIASTINUM: No acute abnormality of the cardiac and mediastinal silhouettes. BONES AND SOFT TISSUES: No acute osseous abnormality. IMPRESSION: 1. Mild pulmonary vascular congestion. 2. New right pleural effusion. Electronically signed by: Lonni Necessary MD 10/31/2023 07:36 AM EDT RP Workstation: [id number]   US  RENAL Result Date: 10/30/2023 CLINICAL DATA:  [id number] AKI (acute kidney injury) (HCC) [id number] EXAM: RENAL / URINARY TRACT ULTRASOUND COMPLETE COMPARISON:  October 30, 2023, April 23, 2019 FINDINGS: Right Kidney: Renal measurements: 9.4 x 5.2 x 4.9 cm = volume: 124 mL. Echogenicity within normal limits. No mass  or hydronephrosis visualized. Portions are suboptimally assessed secondary to shadowing bowel gas. Left Kidney: Renal measurements: 12.7 x 6.0 x 5.6 cm = volume: 220 mL. Echogenicity within normal limits. No mass or hydronephrosis visualized. Bladder: Appears normal for degree of bladder distention. Other: None. IMPRESSION: No hydronephrosis. Electronically Signed   By: Corean Salter M.D.   On: 10/30/2023 09:18   CT Angio Chest/Abd/Pel for Dissection W and/or Wo Contrast Result Date: 10/30/2023 EXAM: CTA CHEST, ABDOMEN AND PELVIS WITH AND WITHOUT CONTRAST 10/30/2023 05:50:35 AM TECHNIQUE: CTA of the chest was performed with and without the administration of intravenous contrast. CTA of the abdomen and pelvis was performed with and without the administration of intravenous contrast. Multiplanar reformatted images are provided for review. MIP images are provided for review. Automated exposure control, iterative reconstruction, and/or weight based adjustment of the mA/kV was utilized to reduce the radiation dose to as low as reasonably achievable. COMPARISON: None available. CLINICAL HISTORY: Patient with a history of mid-sternal chest pain radiating down both arms, suspected acute aortic syndrome (AAS), and hypertension. FINDINGS: VASCULATURE:  AORTA: Scattered atherosclerotic calcifications are present in the distal infrarenal abdominal aorta without aneurysm or focal stenosis. No acute finding. No dissection. PULMONARY ARTERIES: No pulmonary embolism with the limits of this exam. GREAT VESSELS OF AORTIC ARCH: No acute finding. No dissection. No arterial occlusion or significant stenosis. CELIAC TRUNK: No acute finding. No occlusion or significant stenosis. SUPERIOR MESENTERIC ARTERY: No acute finding. No occlusion or significant stenosis. INFERIOR MESENTERIC ARTERY: No acute finding. No occlusion or significant stenosis. RENAL ARTERIES: No acute finding. No occlusion or significant stenosis. ILIAC ARTERIES:  No acute finding. No occlusion or significant stenosis. CHEST: MEDIASTINUM: Subcentimeter paratracheal lymph nodes are likely reactive. The heart is mildly enlarged. The intraventricular septum measures 23 mm. No mediastinal lymphadenopathy. The heart and pericardium demonstrate no acute abnormality. LUNGS AND PLEURA: Mild ground glass attenuation is present in the lungs bilaterally. The right middle lobe lateral pleural-based nodule measures 2.2 x 0.7 cm on image 94 of series 6. Similar nodular opacity laterally in the right lower lobe measures 3.1 x 1.0 cm on image 108 of series 6. Mild dependent atelectasis is present. No evidence of pleural effusion or pneumothorax. THORACIC BONES AND SOFT TISSUES: Right humerus ORIF noted. No acute bone or soft tissue abnormality. ABDOMEN AND PELVIS: LIVER: The liver is unremarkable. GALLBLADDER AND BILE DUCTS: Gallbladder is unremarkable. No biliary ductal dilatation. SPLEEN: The spleen is unremarkable. PANCREAS: The pancreas is unremarkable. ADRENAL GLANDS: Bilateral adrenal glands demonstrate no acute abnormality. KIDNEYS, URETERS AND BLADDER: No stones in the kidneys or ureters. No hydronephrosis. No perinephric or periureteral stranding. Urinary bladder is unremarkable. GI AND BOWEL: Stomach and duodenal sweep demonstrate no acute abnormality. There is no bowel obstruction. No abnormal bowel wall thickening or distension. REPRODUCTIVE: Reproductive organs are unremarkable. PERITONEUM AND RETROPERITONEUM: No ascites or free air. LYMPH NODES: No lymphadenopathy. ABDOMINAL BONES AND SOFT TISSUES: No acute abnormality of the bones. No acute soft tissue abnormality. IMPRESSION: 1. No evidence of acute aortic syndrome. 2. Mildly enlarged heart with intraventricular septum measuring 23 mm. 3. Bilateral mild ground glass attenuation in the lungs with dependent atelectasis. This may represent edema or infection. 4. Right middle lobe lateral pleural-based nodule measuring 2.2 x 0.7  cm and similar nodular opacity in the right lower lobe measuring 3.1 x 1.0 cm. These are likely inflammatory. Recommend follow up CT of the chest without contrast at 3 to 6 months. Electronically signed by: Lonni Necessary MD 10/30/2023 06:14 AM EDT RP Workstation: [id number]   DG Chest Port 1 View Result Date: 10/30/2023 CLINICAL DATA:  Midsternal chest pain EXAM: PORTABLE CHEST 1 VIEW COMPARISON:  05/17/2023 FINDINGS: Cardiopericardial silhouette is at upper limits of normal for size. There is pulmonary vascular congestion without overt pulmonary edema. No substantial pleural effusion. No focal airspace consolidation. No acute bony abnormality. Fixation hardware noted right humerus with posttraumatic deformity of the right clavicle. Telemetry leads overlie the chest. IMPRESSION: Pulmonary vascular congestion without overt pulmonary edema. Electronically Signed   By: Camellia Candle M.D.   On: 10/30/2023 05:03    Microbiology: Recent Results (from the past 240 hours)  Resp panel by RT-PCR (RSV, Flu A&B, Covid) Anterior Nasal Swab     Status: None   Collection Time: 10/30/23  7:38 AM   Specimen: Anterior Nasal Swab  Result Value Ref Range Status   SARS Coronavirus 2 by RT PCR NEGATIVE NEGATIVE Final    Comment: (NOTE) SARS-CoV-2 target nucleic acids are NOT DETECTED.  The SARS-CoV-2 RNA is generally detectable in upper respiratory specimens  during the acute phase of infection. The lowest concentration of SARS-CoV-2 viral copies this assay can detect is 138 copies/mL. A negative result does not preclude SARS-Cov-2 infection and should not be used as the sole basis for treatment or other patient management decisions. A negative result may occur with  improper specimen collection/handling, submission of specimen other than nasopharyngeal swab, presence of viral mutation(s) within the areas targeted by this assay, and inadequate number of viral copies(<138 copies/mL). A negative result must be  combined with clinical observations, patient history, and epidemiological information. The expected result is Negative.  Fact Sheet for Patients:  BloggerCourse.com  Fact Sheet for Healthcare Providers:  SeriousBroker.it  This test is no t yet approved or cleared by the [place]  FDA and  has been authorized for detection and/or diagnosis of SARS-CoV-2 by FDA under an Emergency Use Authorization (EUA). This EUA will remain  in effect (meaning this test can be used) for the duration of the COVID-19 declaration under Section 564(b)(1) of the Act, 21 U.S.C.section 360bbb-3(b)(1), unless the authorization is terminated  or revoked sooner.       Influenza A by PCR NEGATIVE NEGATIVE Final   Influenza B by PCR NEGATIVE NEGATIVE Final    Comment: (NOTE) The Xpert Xpress SARS-CoV-2/FLU/RSV plus assay is intended as an aid in the diagnosis of influenza from Nasopharyngeal swab specimens and should not be used as a sole basis for treatment. Nasal washings and aspirates are unacceptable for Xpert Xpress SARS-CoV-2/FLU/RSV testing.  Fact Sheet for Patients: BloggerCourse.com  Fact Sheet for Healthcare Providers: SeriousBroker.it  This test is not yet approved or cleared by the [place]  FDA and has been authorized for detection and/or diagnosis of SARS-CoV-2 by FDA under an Emergency Use Authorization (EUA). This EUA will remain in effect (meaning this test can be used) for the duration of the COVID-19 declaration under Section 564(b)(1) of the Act, 21 U.S.C. section 360bbb-3(b)(1), unless the authorization is terminated or revoked.     Resp Syncytial Virus by PCR NEGATIVE NEGATIVE Final    Comment: (NOTE) Fact Sheet for Patients: BloggerCourse.com  Fact Sheet for Healthcare Providers: SeriousBroker.it  This test is not yet  approved or cleared by the [place]  FDA and has been authorized for detection and/or diagnosis of SARS-CoV-2 by FDA under an Emergency Use Authorization (EUA). This EUA will remain in effect (meaning this test can be used) for the duration of the COVID-19 declaration under Section 564(b)(1) of the Act, 21 U.S.C. section 360bbb-3(b)(1), unless the authorization is terminated or revoked.  Performed at [facility], [address] [place], [place] [address]      Labs: CBC: Recent Labs  Lab 10/30/23 0405 11/02/23 0358 11/04/23 0849  WBC 15.5* 8.5 10.3  HGB 13.5 13.1 14.2  HCT 40.6 39.1 43.2  MCV 89.4 87.5 88.3  PLT 219 237 312   Basic Metabolic Panel: Recent Labs  Lab 10/31/23 0424 11/01/23 0601 11/02/23 0358 11/03/23 0502 11/04/23 0849  NA 131* 134* 134* 136 136  K 3.4* 3.8 3.6 3.9 4.5  CL 93* 95* 95* 98 96*  CO2 25 28 27 28 27   GLUCOSE 205* 188* 206* 166* 171*  BUN 30* 36* 42* 42* 36*  CREATININE 1.60* 1.53* 1.65* 1.65* 1.61*  CALCIUM 8.9 8.9 8.9 9.2 9.6  MG  --  2.2 2.3 2.5* 2.5*  PHOS  --  3.9  --   --  3.8   Liver Function Tests: Recent Labs  Lab 10/30/23 0405  AST 26  ALT 23  ALKPHOS 104  BILITOT 1.1  PROT 7.4  ALBUMIN  4.1   Recent Labs  Lab 10/30/23 0405  LIPASE 26   No results for input(s): AMMONIA in the last 168 hours. Cardiac Enzymes: No results for input(s): CKTOTAL, CKMB, CKMBINDEX, TROPONINI in the last 168 hours. BNP (last 3 results) Recent Labs    10/30/23 0405  BNP 2,220.6*   CBG: Recent Labs  Lab 11/03/23 1409 11/03/23 1606 11/03/23 2129 11/04/23 0806 11/04/23 1134  GLUCAP 156* 135* 239* 164* 232*    Time spent: 35 minutes  Signed:  Elvan Sor  [facility] 11/04/2023 12:43 PM

## 2023-11-05 ENCOUNTER — Telehealth: Payer: Self-pay | Admitting: Family

## 2023-11-05 LAB — SURGICAL PATHOLOGY

## 2023-11-05 NOTE — Telephone Encounter (Signed)
 Called to confirm/remind patient of their appointment at the [facility] on 11/08/23.   Appointment:   [] Confirmed  [] Left mess   [] No answer/No voice mail  [x] VM Full/unable to leave message  [] Phone not in service  Patient reminded to bring all medications and/or complete list.  Confirmed patient has transportation. Gave directions, instructed to utilize valet parking.
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
## 2023-11-08 ENCOUNTER — Other Ambulatory Visit: Admission: RE | Admit: 2023-11-08 | Source: Ambulatory Visit

## 2023-11-08 ENCOUNTER — Other Ambulatory Visit: Payer: Self-pay | Admitting: Family

## 2023-11-08 ENCOUNTER — Ambulatory Visit (HOSPITAL_BASED_OUTPATIENT_CLINIC_OR_DEPARTMENT_OTHER): Admitting: Family

## 2023-11-08 ENCOUNTER — Telehealth: Payer: Self-pay | Admitting: Pharmacist

## 2023-11-08 ENCOUNTER — Encounter: Payer: Self-pay | Admitting: Family

## 2023-11-08 ENCOUNTER — Other Ambulatory Visit
Admission: RE | Admit: 2023-11-08 | Discharge: 2023-11-08 | Disposition: A | Discharge status: Home / Self Care | Source: Ambulatory Visit | Attending: Family | Admitting: Family

## 2023-11-08 ENCOUNTER — Ambulatory Visit: Payer: Self-pay | Admitting: Family

## 2023-11-08 ENCOUNTER — Ambulatory Visit: Payer: Self-pay | Admitting: Gastroenterology

## 2023-11-08 VITALS — BP 130/82 | HR 77 | Wt 185.2 lb

## 2023-11-08 DIAGNOSIS — I10 Essential (primary) hypertension: Secondary | ICD-10-CM

## 2023-11-08 DIAGNOSIS — E1122 Type 2 diabetes mellitus with diabetic chronic kidney disease: Secondary | ICD-10-CM | POA: Diagnosis not present

## 2023-11-08 DIAGNOSIS — Z720 Tobacco use: Secondary | ICD-10-CM | POA: Diagnosis not present

## 2023-11-08 DIAGNOSIS — N1831 Chronic kidney disease, stage 3a: Secondary | ICD-10-CM

## 2023-11-08 DIAGNOSIS — F419 Anxiety disorder, unspecified: Secondary | ICD-10-CM

## 2023-11-08 DIAGNOSIS — K222 Esophageal obstruction: Secondary | ICD-10-CM

## 2023-11-08 DIAGNOSIS — I5022 Chronic systolic (congestive) heart failure: Secondary | ICD-10-CM

## 2023-11-08 DIAGNOSIS — Z794 Long term (current) use of insulin: Secondary | ICD-10-CM

## 2023-11-08 DIAGNOSIS — E782 Mixed hyperlipidemia: Secondary | ICD-10-CM

## 2023-11-08 DIAGNOSIS — I5032 Chronic diastolic (congestive) heart failure: Secondary | ICD-10-CM | POA: Diagnosis present

## 2023-11-08 LAB — BASIC METABOLIC PANEL WITH GFR
Anion gap: 11 (ref 5–15)
BUN: 26 mg/dL — ABNORMAL HIGH (ref 6–20)
CO2: 23 mmol/L (ref 22–32)
Calcium: 9.2 mg/dL (ref 8.9–10.3)
Chloride: 106 mmol/L (ref 98–111)
Creatinine, Ser: 1.64 mg/dL — ABNORMAL HIGH (ref 0.61–1.24)
GFR, Estimated: 52 mL/min — ABNORMAL LOW (ref >60)
Glucose, Bld: 139 mg/dL — ABNORMAL HIGH (ref 70–99)
Potassium: 4.4 mmol/L (ref 3.5–5.1)
Sodium: 140 mmol/L (ref 135–145)

## 2023-11-08 LAB — LIPID PANEL
Cholesterol: 149 mg/dL (ref 0–200)
HDL: 41 mg/dL (ref >40)
LDL Cholesterol: 85 mg/dL (ref 0–99)
Total CHOL/HDL Ratio: 3.6 ratio
Triglycerides: 116 mg/dL (ref <150)
VLDL: 23 mg/dL (ref 0–40)

## 2023-11-08 MED ORDER — ROSUVASTATIN CALCIUM 5 MG PO TABS: 5.0000 mg | ORAL_TABLET | Freq: Every day | ORAL | 3 refills | Status: AC

## 2023-11-08 MED ORDER — NICOTINE 21 MG/24HR TD PT24: 21.0000 mg | MEDICATED_PATCH | Freq: Every day | TRANSDERMAL | 0 refills | Status: AC

## 2023-11-08 NOTE — Telephone Encounter (Signed)
 Called patient to review medication list at the request of Ellouise Class, however, the patient is not at home and would prefer to be called tomorrow. Will try again then.

## 2023-11-08 NOTE — Patient Instructions (Addendum)
 Call the GI doctor below: Therisa Bi, MD Follow up in 1 month(s).   Specialty: Gastroenterology Contact information: [address] [place] [address] [phone]   Medication Changes:  START Nicotine  patch  Lab Work:  Go over to the [facility]ICAL [facility]ALL. Go pass the gift shop and have your blood work completed.  We will only call you if the results are abnormal or if the provider would like to make medication changes.   Testing/Procedures:    SOMEONE WILL CONTACT YOU IN ORDER TO SCHEDULE YOUR APPOINTMENT   [facility] [address] [place], [place] [address] Please go to the [facility] and check-in with the desk attendant.   Magnetic resonance imaging (MRI) is a painless test that produces images of the inside of the body without using Xrays.  During an MRI, strong magnets and radio waves work together in a Data processing manager to form detailed images.   MRI images may provide more details about a medical condition than X-rays, CT scans, and ultrasounds can provide.  You may be given earphones to listen for instructions.  You may eat a light breakfast and take medications as ordered with the exception of furosemide , hydrochlorothiazide , chlorthalidone or spironolactone  (or any other fluid pill). If you are undergoing a stress MRI, please avoid stimulants for 12 hr prior to test. (I.e. Caffeine, nicotine , chocolate, or antihistamine medications)  If your provider has ordered anti-anxiety medications for this test, then you will need a driver.  An IV will be inserted into one of your veins. Contrast material will be injected into your IV. It will leave your body through your urine within a day. You may be told to drink plenty of fluids to help flush the contrast material out of your system.  You will be asked to remove all metal, including: Watch, jewelry, and other metal objects including hearing aids, hair pieces and dentures. Also  wearable glucose monitoring systems (ie. Freestyle Libre and Omnipods) (Braces and fillings normally are not a problem.)   TEST WILL TAKE APPROXIMATELY 1 HOUR  PLEASE NOTIFY SCHEDULING AT LEAST 24 HOURS IN ADVANCE IF YOU ARE UNABLE TO KEEP YOUR APPOINTMENT. [phone]  For more information and frequently asked questions, please visit our website : [email or web address]  Please call the Cardiac Imaging Nurse Navigators with any questions/concerns. [phone] Office     Special Instructions // Education:  Please have blood work done with one week BEFORE your cardiac mri  Follow-Up in: Please follow up with the [facility] in 3 weeks with Ellouise Class, FNP.  At the [facility], you and your health needs are our priority. We have a designated team specialized in the treatment of Heart Failure. This Care Team includes your primary Heart Failure Specialized Cardiologist (physician), Advanced Practice Providers (APPs- Physician Assistants and Nurse Practitioners), and Pharmacist who all work together to provide you with the care you need, when you need it.   You may see any of the following providers on your designated Care Team at your next follow up:  Dr. Toribio Fuel Dr. Ezra Shuck Dr. Ria Commander Dr. Odis Brownie Ellouise Class, FNP Jaun Bash, RPH-CPP  Please be sure to bring in all your medications bottles to every appointment.   Need to Contact Us :  If you have any questions or concerns before your next appointment please send us  a message through [place] or call our office at [phone].    TO LEAVE A MESSAGE FOR  THE NURSE SELECT OPTION 2, PLEASE LEAVE A MESSAGE INCLUDING: YOUR NAME DATE OF BIRTH CALL BACK NUMBER REASON FOR CALL**this is important as we prioritize the call backs  YOU WILL RECEIVE A CALL BACK THE SAME DAY AS LONG AS YOU CALL BEFORE 4:00 PM

## 2023-11-09 LAB — MISC LABCORP TEST (SEND OUT): Labcorp test code: 143000

## 2023-11-09 NOTE — Telephone Encounter (Signed)
Attempted to call patient again with no response.

## 2023-11-10 ENCOUNTER — Other Ambulatory Visit: Payer: Self-pay

## 2023-11-10 DIAGNOSIS — K222 Esophageal obstruction: Secondary | ICD-10-CM

## 2023-11-10 MED ORDER — OMEPRAZOLE 40 MG PO CPDR: 40.0000 mg | DELAYED_RELEASE_CAPSULE | Freq: Every day | ORAL | 2 refills | Status: DC

## 2023-11-12 ENCOUNTER — Telehealth: Payer: Self-pay

## 2023-11-12 NOTE — Telephone Encounter (Signed)
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
## 2023-11-26 ENCOUNTER — Ambulatory Visit: Admitting: Podiatry

## 2023-11-29 ENCOUNTER — Telehealth: Payer: Self-pay | Admitting: Family

## 2023-11-29 NOTE — Telephone Encounter (Signed)
 Called to confirm/remind patient of their appointment at [facility] on 11/30/23.   Appointment:   [] Confirmed  [] Left mess   [] No answer/No voice mail  [x] VM Full/unable to leave message  [] Phone not in service  Patient reminded to bring all medications and/or complete list.  Confirmed patient has transportation. Gave directions, instructed to utilize valet parking.
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
## 2023-11-30 ENCOUNTER — Ambulatory Visit: Attending: Family | Admitting: Family

## 2023-11-30 ENCOUNTER — Encounter: Payer: Self-pay | Admitting: Family

## 2023-11-30 VITALS — BP 127/81 | HR 73 | Wt 186.0 lb

## 2023-11-30 DIAGNOSIS — F419 Anxiety disorder, unspecified: Secondary | ICD-10-CM | POA: Insufficient documentation

## 2023-11-30 DIAGNOSIS — E119 Type 2 diabetes mellitus without complications: Secondary | ICD-10-CM | POA: Diagnosis not present

## 2023-11-30 DIAGNOSIS — Z720 Tobacco use: Secondary | ICD-10-CM

## 2023-11-30 DIAGNOSIS — E782 Mixed hyperlipidemia: Secondary | ICD-10-CM

## 2023-11-30 DIAGNOSIS — I5022 Chronic systolic (congestive) heart failure: Secondary | ICD-10-CM | POA: Diagnosis present

## 2023-11-30 DIAGNOSIS — E785 Hyperlipidemia, unspecified: Secondary | ICD-10-CM | POA: Insufficient documentation

## 2023-11-30 DIAGNOSIS — E1122 Type 2 diabetes mellitus with diabetic chronic kidney disease: Secondary | ICD-10-CM

## 2023-11-30 DIAGNOSIS — J90 Pleural effusion, not elsewhere classified: Secondary | ICD-10-CM | POA: Insufficient documentation

## 2023-11-30 DIAGNOSIS — N1831 Chronic kidney disease, stage 3a: Secondary | ICD-10-CM

## 2023-11-30 DIAGNOSIS — Z79899 Other long term (current) drug therapy: Secondary | ICD-10-CM | POA: Diagnosis not present

## 2023-11-30 DIAGNOSIS — I10 Essential (primary) hypertension: Secondary | ICD-10-CM

## 2023-11-30 DIAGNOSIS — Z794 Long term (current) use of insulin: Secondary | ICD-10-CM | POA: Diagnosis not present

## 2023-11-30 DIAGNOSIS — F17210 Nicotine dependence, cigarettes, uncomplicated: Secondary | ICD-10-CM | POA: Diagnosis not present

## 2023-11-30 DIAGNOSIS — I110 Hypertensive heart disease with heart failure: Secondary | ICD-10-CM | POA: Diagnosis not present

## 2023-11-30 DIAGNOSIS — Z7984 Long term (current) use of oral hypoglycemic drugs: Secondary | ICD-10-CM | POA: Insufficient documentation

## 2023-11-30 DIAGNOSIS — I428 Other cardiomyopathies: Secondary | ICD-10-CM | POA: Insufficient documentation

## 2023-11-30 DIAGNOSIS — K222 Esophageal obstruction: Secondary | ICD-10-CM | POA: Insufficient documentation

## 2023-11-30 NOTE — Patient Instructions (Signed)
 It was good to see you today!. Get in touch with the eye doctor regarding blurry vision.

## 2023-12-17 ENCOUNTER — Encounter (HOSPITAL_COMMUNITY): Payer: Self-pay

## 2023-12-22 ENCOUNTER — Ambulatory Visit
Admission: RE | Admit: 2023-12-22 | Discharge: 2023-12-22 | Disposition: A | Discharge status: Home / Self Care | Source: Ambulatory Visit | Attending: Family | Admitting: Family

## 2023-12-22 ENCOUNTER — Other Ambulatory Visit: Payer: Self-pay | Admitting: Family

## 2023-12-22 DIAGNOSIS — I5022 Chronic systolic (congestive) heart failure: Secondary | ICD-10-CM | POA: Diagnosis present

## 2023-12-22 MED ORDER — GADOBUTROL 1 MMOL/ML IV SOLN
10.0000 mL | Freq: Once | INTRAVENOUS | Status: AC | PRN
  Administered 2023-12-22: 10 mL via INTRAVENOUS

## 2023-12-24 ENCOUNTER — Telehealth: Payer: Self-pay | Admitting: Cardiology

## 2023-12-24 NOTE — Telephone Encounter (Signed)
 Called to confirm/remind patient of their appointment at the [facility] on 12/24/23.   Appointment:   [] Confirmed  [] Left mess   [] No answer/No voice mail  [x] VM Full/unable to leave message  [] Phone not in service  Patient reminded to bring all medications and/or complete list.  Confirmed patient has transportation. Gave directions, instructed to utilize valet parking.

## 2023-12-27 ENCOUNTER — Encounter: Admitting: Cardiology

## 2024-01-03 ENCOUNTER — Encounter: Payer: Self-pay | Admitting: Gastroenterology

## 2024-01-03 ENCOUNTER — Encounter
Admission: RE | Disposition: A | Discharge status: Home / Self Care | Payer: Self-pay | Source: Home / Self Care | Attending: Gastroenterology

## 2024-01-03 ENCOUNTER — Ambulatory Visit

## 2024-01-03 ENCOUNTER — Telehealth: Payer: Self-pay

## 2024-01-03 ENCOUNTER — Ambulatory Visit
Admission: RE | Admit: 2024-01-03 | Discharge: 2024-01-03 | Disposition: A | Discharge status: Home / Self Care | Attending: Gastroenterology | Admitting: Gastroenterology

## 2024-01-03 DIAGNOSIS — E119 Type 2 diabetes mellitus without complications: Secondary | ICD-10-CM | POA: Diagnosis not present

## 2024-01-03 DIAGNOSIS — K222 Esophageal obstruction: Secondary | ICD-10-CM

## 2024-01-03 DIAGNOSIS — Z794 Long term (current) use of insulin: Secondary | ICD-10-CM | POA: Insufficient documentation

## 2024-01-03 DIAGNOSIS — K200 Eosinophilic esophagitis: Secondary | ICD-10-CM

## 2024-01-03 DIAGNOSIS — K2970 Gastritis, unspecified, without bleeding: Secondary | ICD-10-CM

## 2024-01-03 DIAGNOSIS — R1310 Dysphagia, unspecified: Secondary | ICD-10-CM

## 2024-01-03 DIAGNOSIS — I509 Heart failure, unspecified: Secondary | ICD-10-CM | POA: Diagnosis not present

## 2024-01-03 DIAGNOSIS — I110 Hypertensive heart disease with heart failure: Secondary | ICD-10-CM | POA: Insufficient documentation

## 2024-01-03 DIAGNOSIS — F17210 Nicotine dependence, cigarettes, uncomplicated: Secondary | ICD-10-CM | POA: Insufficient documentation

## 2024-01-03 HISTORY — PX: ESOPHAGOGASTRODUODENOSCOPY: SHX5428

## 2024-01-03 LAB — GLUCOSE, CAPILLARY: Glucose-Capillary: 122 mg/dL — ABNORMAL HIGH (ref 70–99)

## 2024-01-03 SURGERY — EGD (ESOPHAGOGASTRODUODENOSCOPY)
Anesthesia: General

## 2024-01-03 MED ORDER — METOPROLOL TARTRATE 5 MG/5ML IV SOLN
INTRAVENOUS | Status: AC
  Filled 2024-01-03: qty 5

## 2024-01-03 MED ORDER — HYDRALAZINE HCL 20 MG/ML IJ SOLN
INTRAMUSCULAR | Status: DC | PRN
  Administered 2024-01-03 (×2): 10 mg via INTRAVENOUS

## 2024-01-03 MED ORDER — PROPOFOL 10 MG/ML IV BOLUS
INTRAVENOUS | Status: DC | PRN
  Administered 2024-01-03: 50 mg via INTRAVENOUS
  Administered 2024-01-03: 200 mg via INTRAVENOUS
  Administered 2024-01-03: 50 mg via INTRAVENOUS

## 2024-01-03 MED ORDER — SODIUM CHLORIDE 0.9 % IV SOLN: INTRAVENOUS | Status: DC

## 2024-01-03 MED ORDER — LIDOCAINE HCL (PF) 2 % IJ SOLN
INTRAMUSCULAR | Status: AC
  Filled 2024-01-03: qty 5

## 2024-01-03 MED ORDER — METOPROLOL TARTRATE 5 MG/5ML IV SOLN
INTRAVENOUS | Status: DC | PRN
  Administered 2024-01-03: 2.5 mg via INTRAVENOUS

## 2024-01-03 MED ORDER — HYDRALAZINE HCL 20 MG/ML IJ SOLN
INTRAMUSCULAR | Status: AC
  Filled 2024-01-03: qty 1

## 2024-01-03 MED ORDER — LIDOCAINE HCL (CARDIAC) PF 100 MG/5ML IV SOSY
PREFILLED_SYRINGE | INTRAVENOUS | Status: DC | PRN
  Administered 2024-01-03: 100 mg via INTRAVENOUS

## 2024-01-03 NOTE — Telephone Encounter (Signed)
 Needs EGD and dilation at [facility] for recurrent strictures of the esophagus. correct patient

## 2024-01-03 NOTE — Op Note (Signed)
 [facility] Patient Name: Jonathon Page Procedure Date: 01/03/2024 8:35 AM MRN: [id number] Account #: [id number] Date of Birth: 02-Sep-1975 Admit Type: Outpatient Age: 48 Room: [facility] ENDO ROOM 4 Gender: Male Note Status: Finalized Instrument Name: Upper GI Scope [id number] Procedure:             Upper GI endoscopy Indications:           Dysphagia Providers:             Rogelia Copping MD, MD Referring MD:          Talitha Repress (Referring MD) Medicines:             Propofol  per Anesthesia Complications:         No immediate complications. Procedure:             Pre-Anesthesia Assessment:                        - Prior to the procedure, a History and Physical was                         performed, and patient medications and allergies were                         reviewed. The patient's tolerance of previous                         anesthesia was also reviewed. The risks and benefits                         of the procedure and the sedation options and risks                         were discussed with the patient. All questions were                         answered, and informed consent was obtained. Prior                         Anticoagulants: The patient has taken no anticoagulant                         or antiplatelet agents. ASA Grade Assessment: II - A                         patient with mild systemic disease. After reviewing                         the risks and benefits, the patient was deemed in                         satisfactory condition to undergo the procedure.                        After obtaining informed consent, the endoscope was                         passed under direct vision. Throughout the procedure,  the patient's blood pressure, pulse, and oxygen                         saturations were monitored continuously. The Endoscope                         was introduced through the mouth, and advanced to the                          second part of duodenum. The upper GI endoscopy was                         accomplished without difficulty. The patient tolerated                         the procedure well. Findings:      One benign-appearing, intrinsic severe stenosis was found in the lower       third of the esophagus. The stenosis was traversed after dilation. A TTS       dilator was passed through the scope. Dilation with a 01-29-11 mm       balloon dilator was performed to 12 mm.      Localized moderate inflammation was found in the gastric antrum.      The examined duodenum was normal.      Biopsies were obtained with cold forceps for histology in the middle       third of the esophagus. Impression:            - Benign-appearing esophageal stenosis. Dilated.                        - Gastritis.                        - Normal examined duodenum.                        - Biopsy performed in the middle third of the                         esophagus. Recommendation:        - Discharge patient to home.                        - Resume previous diet.                        - Await pathology results.                        - The patient should be sent to [facility] or [facility] for                         further dilation. Procedure Code(s):     --- Professional ---                        [id number], Esophagogastroduodenoscopy, flexible,                         transoral; with transendoscopic balloon dilation of  esophagus (less than 30 mm diameter)                        43239, 59, Esophagogastroduodenoscopy, flexible,                         transoral; with biopsy, single or multiple Diagnosis Code(s):     --- Professional ---                        R13.10, Dysphagia, unspecified                        K22.2, Esophageal obstruction CPT copyright 2022 [employer]. All rights reserved. The codes documented in this report are preliminary and upon coder review may  be revised to meet  current compliance requirements. Rogelia Copping MD, MD 01/03/2024 9:08:40 AM This report has been signed electronically. Number of Addenda: 0 Note Initiated On: 01/03/2024 8:35 AM Estimated Blood Loss:  Estimated blood loss: none.      [facility]
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
## 2024-01-03 NOTE — Anesthesia Postprocedure Evaluation (Signed)
 Anesthesia Post Note  [name]  Procedure(s) Performed: EGD (ESOPHAGOGASTRODUODENOSCOPY)  Patient location during evaluation: Endoscopy Anesthesia Type: General Level of consciousness: awake and alert Pain management: pain level controlled Vital Signs Assessment: post-procedure vital signs reviewed and stable Respiratory status: spontaneous breathing, nonlabored ventilation and respiratory function stable Cardiovascular status: blood pressure returned to baseline and stable Postop Assessment: no apparent nausea or vomiting Anesthetic complications: no   No notable events documented.   Last Vitals:  Vitals:   01/03/24 0916 01/03/24 0926  BP: 120/74 120/77  Pulse: 86 83  Resp: 17 16  Temp:    SpO2: 99% 98%    Last Pain:  Vitals:   01/03/24 0926  TempSrc:   PainSc: 0-No pain                 Camellia Merilee Louder

## 2024-01-03 NOTE — Transfer of Care (Signed)
 Immediate Anesthesia Transfer of Care Note  [name]  Procedure(s) Performed: EGD (ESOPHAGOGASTRODUODENOSCOPY)  Patient Location: Endoscopy Unit  Anesthesia Type:General  Level of Consciousness: drowsy  Airway & Oxygen Therapy: Patient Spontanous Breathing  Post-op Assessment: Report given to RN and Post -op Vital signs reviewed and stable  Post vital signs: Reviewed and stable  Last Vitals:  Vitals Value Taken Time  BP 126/76 01/03/24 09:06  Temp 35.9 C 01/03/24 09:06  Pulse 83 01/03/24 09:12  Resp 19 01/03/24 09:13  SpO2 98 % 01/03/24 09:12  Vitals shown include unfiled device data.  Last Pain:  Vitals:   01/03/24 0906  TempSrc: Tympanic  PainSc: Asleep         Complications: No notable events documented.

## 2024-01-04 LAB — SURGICAL PATHOLOGY

## 2024-01-04 NOTE — Telephone Encounter (Signed)
 Referral with procedures attached faxed to [facility]--marked Urgent

## 2024-01-06 ENCOUNTER — Ambulatory Visit: Payer: Self-pay | Admitting: Gastroenterology

## 2024-01-18 ENCOUNTER — Telehealth: Payer: Self-pay | Admitting: Cardiology

## 2024-01-18 NOTE — Telephone Encounter (Signed)
 Called to confirm/remind patient of their appointment at the [facility] on 01/18/24.   Appointment:   [] Confirmed  [x] Left mess   [] No answer/No voice mail  [] VM Full/unable to leave message  [] Phone not in service  Patient reminded to bring all medications and/or complete list.  Confirmed patient has transportation. Gave directions, instructed to utilize valet parking.

## 2024-01-19 ENCOUNTER — Other Ambulatory Visit: Payer: Self-pay

## 2024-01-19 ENCOUNTER — Encounter: Admitting: Cardiology

## 2024-01-19 MED ORDER — OMEPRAZOLE 40 MG PO CPDR: 40.0000 mg | DELAYED_RELEASE_CAPSULE | Freq: Every day | ORAL | 5 refills | Status: AC

## 2024-01-25 NOTE — Telephone Encounter (Signed)
 Lab letter mailed.

## 2024-02-11 ENCOUNTER — Telehealth: Payer: Self-pay | Admitting: Family

## 2024-02-11 NOTE — Telephone Encounter (Signed)
 Called to confirm/remind patient of their appointment at the [facility] on 02/14/24.   Appointment:   [] Confirmed  [x] Left mess   [] No answer/No voice mail  [] VM Full/unable to leave message  [] Phone not in service  Patient reminded to bring all medications and/or complete list.  Confirmed patient has transportation. Gave directions, instructed to utilize valet parking.

## 2024-02-14 ENCOUNTER — Encounter: Admitting: Cardiology

## 2024-02-14 NOTE — Progress Notes (Deleted)
   ADVANCED HEART FAILURE FOLLOW UP CLINIC NOTE  Referring Physician: Austin Mutton, MD  Primary Care: Austin Mutton, MD Primary Cardiologist:  HPI: Jonathon Page is a 48 y.o. male who presents for follow up of ***.      Admitted 10/30/23 with chest pain and shortness of breath.On admission, BNP was 2220.6, HS-troponin was 57, PCT was <0.1 and UDS positive for amphetamines. Echo 10/31/23: LVEF of 45-50% with moderate concentric hypertrophy, grade III diastolic dysfunction, low normal RV function, moderate TR. Read recommended consideration for infiltrative disease. Barium swallow showed esophageal stricture. GI consulted for evaluation. EGD performed 11/03/23 with dilation of esophageal stricture - plans to repeat in 4 weeks. Elevated troponin thought to be due to demand ischemia. CT angiogram, did not show any renal artery stenosis.      SUBJECTIVE:  PMH, current medications, allergies, social history, and family history reviewed in epic.  PHYSICAL EXAM: There were no vitals filed for this visit. GENERAL: Well nourished and in no apparent distress at rest.  PULM:  Normal work of breathing, clear to auscultation bilaterally. Respirations are unlabored.  CARDIAC:  JVP: ***         Normal rate with regular rhythm. No murmurs, rubs or gallops.  *** edema. Warm and well perfused extremities. ABDOMEN: Soft, non-tender, non-distended. NEUROLOGIC: Patient is oriented x3 with no focal or lateralizing neurologic deficits.    DATA REVIEW  ECG: ***    ECHO: ***   CATH: ***   Heart failure review: - Classification: {HFCLASS:30917} - Etiology: {Cardiomyopathy:30918} - NYHA Class:  - Volume status: {volumechf:30919} - ACEi/ARB/ARNI: {HF:30752} - Aldosterone antagonist: {HF:30752} - Beta-blocker: {HF:30752} - Digoxin: {HF:30752} - Hydralazine /Nitrates: {HF:30752} - SGLT2i: {HF:30752} - GLP-1: {GLP:30906} - Advanced therapies: {Advancedtherapies:30916} - ICD: {ICD:30901}  ASSESSMENT &  PLAN:  ***  Follow up in ***  Morene Brownie, MD Advanced Heart Failure Mechanical Circulatory Support 02/14/24
